# Patient Record
Sex: Female | Born: 1955 | Race: Black or African American | Hispanic: No | Marital: Married | Smoking: Never smoker
Health system: Southern US, Community
[De-identification: ages and names within clinical notes are randomized; demographics above are authoritative.]

---

## 2015-12-09 DIAGNOSIS — E875 Hyperkalemia: Secondary | ICD-10-CM | POA: Insufficient documentation

## 2015-12-09 DIAGNOSIS — Z992 Dependence on renal dialysis: Secondary | ICD-10-CM | POA: Insufficient documentation

## 2015-12-12 DIAGNOSIS — I10 Essential (primary) hypertension: Secondary | ICD-10-CM | POA: Diagnosis present

## 2015-12-12 DIAGNOSIS — I2510 Atherosclerotic heart disease of native coronary artery without angina pectoris: Secondary | ICD-10-CM | POA: Insufficient documentation

## 2015-12-12 DIAGNOSIS — J449 Chronic obstructive pulmonary disease, unspecified: Secondary | ICD-10-CM | POA: Insufficient documentation

## 2021-05-08 ENCOUNTER — Emergency Department (HOSPITAL_COMMUNITY): Payer: Medicaid Other

## 2021-05-08 ENCOUNTER — Inpatient Hospital Stay (HOSPITAL_COMMUNITY)
Admission: EM | Admit: 2021-05-08 | Discharge: 2021-05-10 | DRG: 070 | Disposition: A | Discharge status: Home / Self Care | Payer: Medicaid Other | Attending: Internal Medicine | Admitting: Internal Medicine

## 2021-05-08 ENCOUNTER — Encounter (HOSPITAL_COMMUNITY): Payer: Self-pay | Admitting: Emergency Medicine

## 2021-05-08 DIAGNOSIS — N2581 Secondary hyperparathyroidism of renal origin: Secondary | ICD-10-CM | POA: Diagnosis present

## 2021-05-08 DIAGNOSIS — Z7982 Long term (current) use of aspirin: Secondary | ICD-10-CM

## 2021-05-08 DIAGNOSIS — R4182 Altered mental status, unspecified: Secondary | ICD-10-CM

## 2021-05-08 DIAGNOSIS — I482 Chronic atrial fibrillation, unspecified: Secondary | ICD-10-CM | POA: Diagnosis present

## 2021-05-08 DIAGNOSIS — Z91013 Allergy to seafood: Secondary | ICD-10-CM

## 2021-05-08 DIAGNOSIS — D631 Anemia in chronic kidney disease: Secondary | ICD-10-CM | POA: Diagnosis present

## 2021-05-08 DIAGNOSIS — I10 Essential (primary) hypertension: Secondary | ICD-10-CM | POA: Diagnosis present

## 2021-05-08 DIAGNOSIS — J9611 Chronic respiratory failure with hypoxia: Secondary | ICD-10-CM

## 2021-05-08 DIAGNOSIS — Z20822 Contact with and (suspected) exposure to covid-19: Secondary | ICD-10-CM | POA: Diagnosis present

## 2021-05-08 DIAGNOSIS — I2510 Atherosclerotic heart disease of native coronary artery without angina pectoris: Secondary | ICD-10-CM | POA: Diagnosis present

## 2021-05-08 DIAGNOSIS — Z6841 Body Mass Index (BMI) 40.0 and over, adult: Secondary | ICD-10-CM

## 2021-05-08 DIAGNOSIS — R748 Abnormal levels of other serum enzymes: Secondary | ICD-10-CM

## 2021-05-08 DIAGNOSIS — M79605 Pain in left leg: Secondary | ICD-10-CM

## 2021-05-08 DIAGNOSIS — Z862 Personal history of diseases of the blood and blood-forming organs and certain disorders involving the immune mechanism: Secondary | ICD-10-CM

## 2021-05-08 DIAGNOSIS — Z992 Dependence on renal dialysis: Secondary | ICD-10-CM

## 2021-05-08 DIAGNOSIS — Z888 Allergy status to other drugs, medicaments and biological substances status: Secondary | ICD-10-CM

## 2021-05-08 DIAGNOSIS — Z7951 Long term (current) use of inhaled steroids: Secondary | ICD-10-CM

## 2021-05-08 DIAGNOSIS — K219 Gastro-esophageal reflux disease without esophagitis: Secondary | ICD-10-CM

## 2021-05-08 DIAGNOSIS — Z7901 Long term (current) use of anticoagulants: Secondary | ICD-10-CM

## 2021-05-08 DIAGNOSIS — Z79899 Other long term (current) drug therapy: Secondary | ICD-10-CM

## 2021-05-08 DIAGNOSIS — G9341 Metabolic encephalopathy: Principal | ICD-10-CM | POA: Diagnosis present

## 2021-05-08 DIAGNOSIS — N186 End stage renal disease: Secondary | ICD-10-CM

## 2021-05-08 DIAGNOSIS — Z91041 Radiographic dye allergy status: Secondary | ICD-10-CM

## 2021-05-08 DIAGNOSIS — J449 Chronic obstructive pulmonary disease, unspecified: Secondary | ICD-10-CM | POA: Diagnosis present

## 2021-05-08 DIAGNOSIS — I120 Hypertensive chronic kidney disease with stage 5 chronic kidney disease or end stage renal disease: Secondary | ICD-10-CM | POA: Diagnosis present

## 2021-05-08 DIAGNOSIS — M170 Bilateral primary osteoarthritis of knee: Secondary | ICD-10-CM

## 2021-05-08 DIAGNOSIS — Z886 Allergy status to analgesic agent status: Secondary | ICD-10-CM

## 2021-05-08 LAB — AMMONIA: Ammonia: 24 umol/L (ref 9–35)

## 2021-05-08 LAB — COMPREHENSIVE METABOLIC PANEL
ALT: 19 U/L (ref 0–44)
AST: 22 U/L (ref 15–41)
Albumin: 4.1 g/dL (ref 3.5–5.0)
Alkaline Phosphatase: 158 U/L — ABNORMAL HIGH (ref 38–126)
Anion gap: 22 — ABNORMAL HIGH (ref 5–15)
BUN: 44 mg/dL — ABNORMAL HIGH (ref 8–23)
CO2: 21 mmol/L — ABNORMAL LOW (ref 22–32)
Calcium: 8.5 mg/dL — ABNORMAL LOW (ref 8.9–10.3)
Chloride: 100 mmol/L (ref 98–111)
Creatinine, Ser: 10.64 mg/dL — ABNORMAL HIGH (ref 0.44–1.00)
GFR, Estimated: 4 mL/min — ABNORMAL LOW (ref >60)
Glucose, Bld: 118 mg/dL — ABNORMAL HIGH (ref 70–99)
Potassium: 4.4 mmol/L (ref 3.5–5.1)
Sodium: 143 mmol/L (ref 135–145)
Total Bilirubin: 0.8 mg/dL (ref 0.3–1.2)
Total Protein: 7.1 g/dL (ref 6.5–8.1)

## 2021-05-08 LAB — RESP PANEL BY RT-PCR (FLU A&B, COVID) ARPGX2
Influenza A by PCR: NEGATIVE
Influenza B by PCR: NEGATIVE
SARS Coronavirus 2 by RT PCR: NEGATIVE

## 2021-05-08 LAB — CK: Total CK: 186 U/L (ref 38–234)

## 2021-05-08 LAB — BLOOD GAS, VENOUS
Acid-Base Excess: 1.4 mmol/L (ref 0.0–2.0)
Bicarbonate: 24.9 mmol/L (ref 20.0–28.0)
Drawn by: 1517
FIO2: 28
O2 Saturation: 89.9 %
Patient temperature: 35.2
pCO2, Ven: 45.1 mmHg (ref 44.0–60.0)
pH, Ven: 7.375 (ref 7.250–7.430)
pO2, Ven: 62.5 mmHg — ABNORMAL HIGH (ref 32.0–45.0)

## 2021-05-08 LAB — CBG MONITORING, ED: Glucose-Capillary: 110 mg/dL — ABNORMAL HIGH (ref 70–99)

## 2021-05-08 LAB — CBC WITH DIFFERENTIAL/PLATELET
Abs Immature Granulocytes: 0.06 10*3/uL (ref 0.00–0.07)
Basophils Absolute: 0.1 10*3/uL (ref 0.0–0.1)
Basophils Relative: 0 %
Eosinophils Absolute: 0.4 10*3/uL (ref 0.0–0.5)
Eosinophils Relative: 3 %
HCT: 40.5 % (ref 36.0–46.0)
Hemoglobin: 12.6 g/dL (ref 12.0–15.0)
Immature Granulocytes: 0 %
Lymphocytes Relative: 23 %
Lymphs Abs: 3.1 10*3/uL (ref 0.7–4.0)
MCH: 30.1 pg (ref 26.0–34.0)
MCHC: 31.1 g/dL (ref 30.0–36.0)
MCV: 96.7 fL (ref 80.0–100.0)
Monocytes Absolute: 0.9 10*3/uL (ref 0.1–1.0)
Monocytes Relative: 7 %
Neutro Abs: 9.1 10*3/uL — ABNORMAL HIGH (ref 1.7–7.7)
Neutrophils Relative %: 67 %
Platelets: 186 10*3/uL (ref 150–400)
RBC: 4.19 MIL/uL (ref 3.87–5.11)
RDW: 16.7 % — ABNORMAL HIGH (ref 11.5–15.5)
WBC: 13.6 10*3/uL — ABNORMAL HIGH (ref 4.0–10.5)
nRBC: 0 % (ref 0.0–0.2)

## 2021-05-08 LAB — APTT: aPTT: 31 seconds (ref 24–36)

## 2021-05-08 LAB — PROTIME-INR
INR: 1 (ref 0.8–1.2)
Prothrombin Time: 13.3 seconds (ref 11.4–15.2)

## 2021-05-08 LAB — LACTIC ACID, PLASMA: Lactic Acid, Venous: 3 mmol/L (ref 0.5–1.9)

## 2021-05-08 NOTE — ED Triage Notes (Signed)
Pt brought in from home. Family called EMS for unresponsive. Pt altered at this time. Pt only received half of her last dialysis treatment on Saturday.

## 2021-05-08 NOTE — ED Provider Notes (Signed)
Are assumed from Dr. Sabra Heck, patient with altered mental status, pending CT of head and ammonia level. Will need admission.  CT scan is unremarkable.  Ammonia levels normal.  Patient continues to show altered mental status consistent with metabolic encephalopathy.  Case is discussed with Dr. Clearence Ped of [facility], who agrees to admit the patient.  Results for orders placed or performed during the hospital encounter of 05/08/21  Resp Panel by RT-PCR (Flu A&B, Covid) Nasopharyngeal Swab   Specimen: Nasopharyngeal Swab; Nasopharyngeal(NP) swabs in vial transport medium  Result Value Ref Range   SARS Coronavirus 2 by RT PCR NEGATIVE NEGATIVE   Influenza A by PCR NEGATIVE NEGATIVE   Influenza B by PCR NEGATIVE NEGATIVE  Blood Cultures (routine x 2)   Specimen: Right Antecubital; Blood  Result Value Ref Range   Specimen Description      RIGHT ANTECUBITAL BOTTLES DRAWN AEROBIC AND ANAEROBIC   Special Requests      Blood Culture adequate volume Performed at [facility], [address], [place], [place] [address]    Culture PENDING    Report Status PENDING   Comprehensive metabolic panel  Result Value Ref Range   Sodium 143 135 - 145 mmol/L   Potassium 4.4 3.5 - 5.1 mmol/L   Chloride 100 98 - 111 mmol/L   CO2 21 (L) 22 - 32 mmol/L   Glucose, Bld 118 (H) 70 - 99 mg/dL   BUN 44 (H) 8 - 23 mg/dL   Creatinine, Ser 10.64 (H) 0.44 - 1.00 mg/dL   Calcium 8.5 (L) 8.9 - 10.3 mg/dL   Total Protein 7.1 6.5 - 8.1 g/dL   Albumin 4.1 3.5 - 5.0 g/dL   AST 22 15 - 41 U/L   ALT 19 0 - 44 U/L   Alkaline Phosphatase 158 (H) 38 - 126 U/L   Total Bilirubin 0.8 0.3 - 1.2 mg/dL   GFR, Estimated 4 (L) >60 mL/min   Anion gap 22 (H) 5 - 15  CBC WITH DIFFERENTIAL  Result Value Ref Range   WBC 13.6 (H) 4.0 - 10.5 K/uL   RBC 4.19 3.87 - 5.11 MIL/uL   Hemoglobin 12.6 12.0 - 15.0 g/dL   HCT 40.5 36.0 - 46.0 %   MCV 96.7 80.0 - 100.0 fL   MCH 30.1 26.0 - 34.0 pg   MCHC 31.1 30.0 - 36.0 g/dL    RDW 16.7 (H) 11.5 - 15.5 %   Platelets 186 150 - 400 K/uL   nRBC 0.0 0.0 - 0.2 %   Neutrophils Relative % 67 %   Neutro Abs 9.1 (H) 1.7 - 7.7 K/uL   Lymphocytes Relative 23 %   Lymphs Abs 3.1 0.7 - 4.0 K/uL   Monocytes Relative 7 %   Monocytes Absolute 0.9 0.1 - 1.0 K/uL   Eosinophils Relative 3 %   Eosinophils Absolute 0.4 0.0 - 0.5 K/uL   Basophils Relative 0 %   Basophils Absolute 0.1 0.0 - 0.1 K/uL   Immature Granulocytes 0 %   Abs Immature Granulocytes 0.06 0.00 - 0.07 K/uL  Ammonia  Result Value Ref Range   Ammonia 24 9 - 35 umol/L  Lactic acid, plasma  Result Value Ref Range   Lactic Acid, Venous 3.0 (HH) 0.5 - 1.9 mmol/L  CK  Result Value Ref Range   Total CK 186 38 - 234 U/L  Blood gas, venous  Result Value Ref Range   FIO2 28.00    pH, Ven 7.375 7.250 - 7.430  pCO2, Ven 45.1 44.0 - 60.0 mmHg   pO2, Ven 62.5 (H) 32.0 - 45.0 mmHg   Bicarbonate 24.9 20.0 - 28.0 mmol/L   Acid-Base Excess 1.4 0.0 - 2.0 mmol/L   O2 Saturation 89.9 %   Patient temperature 35.2    Collection site BLOOD RIGHT HAND    Drawn by 1517    Sample type VENOUS   Protime-INR  Result Value Ref Range   Prothrombin Time 13.3 11.4 - 15.2 seconds   INR 1.0 0.8 - 1.2  APTT  Result Value Ref Range   aPTT 31 24 - 36 seconds  CBG monitoring, ED  Result Value Ref Range   Glucose-Capillary 110 (H) 70 - 99 mg/dL   DG Chest Port 1 View  Result Date: 05/08/2021 CLINICAL DATA:  Altered mental status EXAM: PORTABLE CHEST 1 VIEW COMPARISON:  04/12/2021 FINDINGS: Cardiac shadow is enlarged but stable. 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 The lungs are well aerated bilaterally. Mild central vascular congestion is seen. No bony abnormality is noted. Changes of prior vascular stenting are noted in the left shoulder region. IMPRESSION: Mild vascular congestion without interstitial edema. Electronically Signed   By: Inez Catalina M.D.   On: 05/08/2021 23:02   CT HEAD CODE STROKE WO CONTRAST  Addendum Date:  05/08/2021   ADDENDUM REPORT: 05/08/2021 23:38 ADDENDUM: These results were called by telephone at the time of interpretation on 05/08/2021 at 58:30 pm to Dr. Roxanne Mins, who verbally acknowledged these results. Electronically Signed   By: Ulyses Jarred M.D.   On: 05/08/2021 23:38   Result Date: 05/08/2021 CLINICAL DATA:  Code stroke.  Acute neurologic deficits EXAM: CT HEAD WITHOUT CONTRAST TECHNIQUE: Contiguous axial images were obtained from the base of the skull through the vertex without intravenous contrast. COMPARISON:  None. FINDINGS: Brain: There is no mass, hemorrhage or extra-axial collection. The size and configuration of the ventricles and extra-axial CSF spaces are normal. The brain parenchyma is normal, without evidence of acute or chronic infarction. Vascular: No abnormal hyperdensity of the major intracranial arteries or dural venous sinuses. No intracranial atherosclerosis. Skull: The visualized skull base, calvarium and extracranial soft tissues are normal. Sinuses/Orbits: No fluid levels or advanced mucosal thickening of the visualized paranasal sinuses. No mastoid or middle ear effusion. The orbits are normal. ASPECTS [facility] Stroke Program Early CT Score) - Ganglionic level infarction (caudate, lentiform nuclei, internal capsule, insula, M1-M3 cortex): 7 - Supraganglionic infarction (M4-M6 cortex): 3 Total score (0-10 with 10 being normal): 10 IMPRESSION: 1. Normal head CT. 2. ASPECTS is 10. Electronically Signed: By: Ulyses Jarred M.D. On: 05/08/2021 23:33    Images viewed by me.    Delora Fuel, MD 94/07/68 [phone]

## 2021-05-08 NOTE — ED Notes (Signed)
Patient transported to CT 

## 2021-05-08 NOTE — ED Provider Notes (Signed)
[facility] EMERGENCY DEPARTMENT Provider Note   CSN: [id number] Arrival date & time: 05/08/21  2157     History Chief Complaint  Patient presents with   Altered Mental Status   Level 5 caveat Leslie Wilcox is a 65 y.o. female. Patient presents to the emergency department for altered mental status.  She has a dialysis patient and last had dialysis on Saturday, however she only received half of her treatment.  Awaiting family for more history.  According to granddaughter, last known normal was 2 PM today.  Apparently patient went for a nap and when she woke up around 9 PM today she did not seem like herself.  They said she was "shaking" and unsteady on her feet. She was not speaking like she normally does. Apparently patient's baseline is needing minimal assistance. She walks with a walker at home.     Altered Mental Status Presenting symptoms: confusion       Past Medical History:  Diagnosis Date   Arthritis    Asthma    CHF (congestive heart failure) (HCC)    COPD (chronic obstructive pulmonary disease) ([place])    Coronary artery disease    Dialysis patient [facility])    Hypertension    Renal disorder     There are no problems to display for this patient.   History reviewed. No pertinent surgical history.   OB History   No obstetric history on file.     History reviewed. No pertinent family history.     Home Medications Prior to Admission medications   Not on File    Allergies    Amoxicillin, Asa [aspirin], Gabapentin, Iodinated contrast media, and Shellfish allergy  Review of Systems   Review of Systems  Unable to perform ROS: Mental status change  Psychiatric/Behavioral:  Positive for confusion.    Physical Exam Updated Vital Signs BP (!) 144/68    Pulse 91    Temp (!) 95.4 F (35.2 C) (Rectal)    Resp 11    SpO2 100%   Physical Exam Vitals and nursing note reviewed.  Constitutional:      General: She is not in acute distress.    Appearance:  Normal appearance. She is obese. She is ill-appearing. She is not toxic-appearing or diaphoretic.     Comments: She is chronically ill-appearing  HENT:     Head: Normocephalic and atraumatic.     Nose: No nasal deformity.     Mouth/Throat:     Lips: Pink. No lesions.     Mouth: Mucous membranes are moist. No injury, lacerations, oral lesions or angioedema.     Pharynx: Oropharynx is clear. Uvula midline. No pharyngeal swelling, oropharyngeal exudate, posterior oropharyngeal erythema or uvula swelling.  Eyes:     General: Gaze aligned appropriately. Scleral icterus present.        Right eye: No discharge.        Left eye: No discharge.     Conjunctiva/sclera: Conjunctivae normal.     Right eye: Right conjunctiva is not injected. No exudate or hemorrhage.    Left eye: Left conjunctiva is not injected. No exudate or hemorrhage.    Pupils: Pupils are equal, round, and reactive to light.  Cardiovascular:     Rate and Rhythm: Regular rhythm. Tachycardia present.     Pulses: Normal pulses.          Radial pulses are 2+ on the right side and 2+ on the left side.       Dorsalis pedis  pulses are 2+ on the right side and 2+ on the left side.     Heart sounds: Normal heart sounds, S1 normal and S2 normal. Heart sounds not distant. No murmur heard.   No friction rub. No gallop. No S3 or S4 sounds.     Comments: Fistula on left upper arm with thrill and bruit Pulmonary:     Effort: Pulmonary effort is normal. No accessory muscle usage or respiratory distress.     Breath sounds: Normal breath sounds. No stridor. No wheezing, rhonchi or rales.     Comments: Bilateral lungs are diminished to auscultation likely due to body habitus. Chest:     Chest wall: No tenderness.  Abdominal:     General: Abdomen is flat. Bowel sounds are normal. There is no distension.     Palpations: Abdomen is soft. There is no mass or pulsatile mass.     Tenderness: There is no abdominal tenderness. There is no guarding or  rebound.  Musculoskeletal:     Right lower leg: No edema.     Left lower leg: No edema.  Skin:    General: Skin is warm and dry.     Coloration: Skin is not jaundiced or pale.     Findings: No bruising, erythema, lesion or rash.  Neurological:     General: No focal deficit present.     Mental Status: She is alert.     GCS: GCS eye subscore is 4. GCS verbal subscore is 4. GCS motor subscore is 6.     Cranial Nerves: Dysarthria present. No facial asymmetry.     Motor: No weakness, tremor or seizure activity.     Coordination: Finger-Nose-Finger Test abnormal.     Comments: Patient does follow commands.  However speech is limited.  She has very dysarthric speech, but nods yes and no appropriately.  She was able to move all 4 extremities on command.  No appreciable focal weakness noted.  No facial droop.  Pupils are round equal and reactive bilaterally.  She had difficulty doing finger to nose testing, however, she was eventually able to do this. Her eyes track from side to side.   Psychiatric:        Mood and Affect: Mood normal.        Behavior: Behavior normal. Behavior is cooperative.    ED Results / Procedures / Treatments   Labs (all labs ordered are listed, but only abnormal results are displayed) Labs Reviewed  COMPREHENSIVE METABOLIC PANEL - Abnormal; Notable for the following components:      Result Value   CO2 21 (*)    Glucose, Bld 118 (*)    BUN 44 (*)    Creatinine, Ser 10.64 (*)    Calcium 8.5 (*)    Alkaline Phosphatase 158 (*)    GFR, Estimated 4 (*)    Anion gap 22 (*)    All other components within normal limits  CBC WITH DIFFERENTIAL/PLATELET - Abnormal; Notable for the following components:   WBC 13.6 (*)    RDW 16.7 (*)    Neutro Abs 9.1 (*)    All other components within normal limits  LACTIC ACID, PLASMA - Abnormal; Notable for the following components:   Lactic Acid, Venous 3.0 (*)    All other components within normal limits  BLOOD GAS, VENOUS -  Abnormal; Notable for the following components:   pO2, Ven 62.5 (*)    All other components within normal limits  CBG MONITORING, ED - Abnormal;  Notable for the following components:   Glucose-Capillary 110 (*)    All other components within normal limits  RESP PANEL BY RT-PCR (FLU A&B, COVID) [id number]  CULTURE, BLOOD (ROUTINE X 2)  CULTURE, BLOOD (ROUTINE X 2)  CK  PROTIME-INR  APTT  AMMONIA  CBG MONITORING, ED    EKG EKG Interpretation  Date/Time:  Sunday May 08 2021 22:02:18 EST Ventricular Rate:  101 PR Interval:  137 QRS Duration: 99 QT Interval:  359 QTC Calculation: 466 R Axis:   40 Text Interpretation: duplicate, discard Confirmed by Delora Fuel ([id number]) on 05/08/2021 11:18:58 PM  Radiology DG Chest Port 1 View  Result Date: 05/08/2021 CLINICAL DATA:  Altered mental status EXAM: PORTABLE CHEST 1 VIEW COMPARISON:  04/12/2021 FINDINGS: Cardiac shadow is enlarged but stable. 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 The lungs are well aerated bilaterally. Mild central vascular congestion is seen. No bony abnormality is noted. Changes of prior vascular stenting are noted in the left shoulder region. IMPRESSION: Mild vascular congestion without interstitial edema. Electronically Signed   By: Inez Catalina M.D.   On: 05/08/2021 23:02    Procedures Procedures   Medications Ordered in ED Medications - No data to display  ED Course  I have reviewed the triage vital signs and the nursing notes.  Pertinent labs & imaging results that were available during my care of the patient were reviewed by me and considered in my medical decision making (see chart for details).  Clinical Course as of 05/08/21 2320  Sun May 08, 2021  2247 Lactic Acid, Venous(!!): 3.0 [GL]  2247 WBC(!): 13.6 [GL]  2248 Last known normal 2 pm [GL]    Clinical Course User Index [GL] Jacquie Lukes, Adora Fridge, PA-C   MDM Rules/Calculators/A&P                          This is a 65 year old female who presents  the emergency department with altered mental status.  Her last known normal was apparently 2 PM today where she went for a nap.  She woke up and according to family, was very altered from her baseline.  Her past medical history is significant for end-stage renal disease requiring dialysis.  She last had dialysis on Saturday where she received half of her treatment due to being late for it.  She is anuric at baseline.  Hx of History of COPD and uses 2 L of home oxygen.  She otherwise has been dealing with sciatica of her left lower extremity and apparently was placed on new medications recently and she is concerned that this could be causing some of the patient's symptoms.  At this time, her daughter is going home to gather her prescriptions as we have no record of them.  We have no other record of patient's medical history on file.  On exam, patient is encephalopathic.  She does not have any focal neurological deficits, however her speech does seem dysarthric and difficult to understand.  She is following commands, however it takes several attempts before she will do what you ask.  Her lungs are diminished bilaterally.  No wheezing or crackles heard on auscultation.  No abdominal tenderness on exam.  Will obtain head CT to evaluate for intracranial abnormality.  Also obtain blood cultures, basic labs, ammonia, lactic acid, chest x-ray, EKG, CK, and VBG.  11:20 PM Care of Chaney Born  transferred to Dr. Roxanne Mins at the end of my shift as the patient will  require reassessment once labs/imaging have resulted. Patient presentation, ED course, and plan of care discussed with review of all pertinent labs and imaging. Please see his/her note for further details regarding further ED course and disposition. Plan at time of handoff is follow up on labs and imaging, likely admit. This may be altered or completely changed at the discretion of the oncoming team pending results of further workup.     Final Clinical  Impression(s) / ED Diagnoses Final diagnoses:  AMS (altered mental status)    Rx / DC Orders ED Discharge Orders     None        Adolphus Birchwood, PA-C 05/08/21 2320    Noemi Chapel, MD 05/08/21 2326

## 2021-05-08 NOTE — ED Provider Notes (Signed)
Medical screening examination/treatment/procedure(s) were conducted as a shared visit with non-physician practitioner(s) and myself.  I personally evaluated the patient during the encounter.  Clinical Impression:   Final diagnoses:  AMS (altered mental status)      This patient is a morbidly obese 65 year old female, she is on dialysis, she has access in her left upper extremity and last went to dialysis yesterday for 3 out of the 4 hours because she showed up late.  Evidently the patient has recently been prescribed a medication for the pain in her left leg, the family members who are the primary historians at the bedside do not know the name of the medication but they will go home and get it.  Every time she takes the medications they think that she has some confusion.  She was last seen normal today at around 2:00 in the afternoon when she went down for a nap, when she woke up a short time ago she was very altered, she could not answer questions, she seemed to be agitated, there was some shaking spells that they witnessed though she was totally lucid during this time.  Her speech has been slurred, erratic, not answering questions appropriately.  The patient is not able to give me any valuable information.  On exam the patient has a normal blood pressure normal heart rate no edema of the legs, she does have an intact fistula with a good thrill in the left upper extremity, lungs are clear and abdomen is soft.  Her mental status is very abnormal, she appears encephalopathic, she is agitated moving around in the bed, she is able to move both arms and both legs but seems to do this very slowly when asked to do so.  She cannot perform accurate finger-nose-finger with either upper extremity but is able to grab my fingers occasionally when she has prompted.  When the patient becomes very agitated by my exam more has pain in her left leg when I manipulate the leg she speaks very clearly about not wanting that  to happen.  She has normal pupillary exam and is able to cross the midline bilaterally, she does not appear to have any extinction.  There is no facial droop.  The patient is encephalopathic, this may be related to medications as the family members do think that she took the pain medications prior to taking her nap.  CT scan will be ordered, she will likely need an observational admission for further evaluation unless she completely clears and is able to give a very clear history.  The family members report that she has not had any history of stroke in the past.  They will go home and gather her medicines ([place]).  At change of shift - care signed out to Dr. Roxanne Mins to f/u results and disposition accordingly.   Noemi Chapel, MD 05/08/21 [phone]

## 2021-05-08 NOTE — ED Notes (Signed)
Was not able to get any urine from in and out cath. PA made aware.

## 2021-05-09 ENCOUNTER — Encounter (HOSPITAL_COMMUNITY): Payer: Self-pay | Admitting: Family Medicine

## 2021-05-09 ENCOUNTER — Inpatient Hospital Stay (HOSPITAL_COMMUNITY)
Admit: 2021-05-09 | Discharge: 2021-05-09 | Disposition: A | Discharge status: Home / Self Care | Payer: Medicaid Other | Attending: Family Medicine | Admitting: Family Medicine

## 2021-05-09 ENCOUNTER — Other Ambulatory Visit: Payer: Self-pay

## 2021-05-09 ENCOUNTER — Observation Stay (HOSPITAL_COMMUNITY): Payer: Medicaid Other

## 2021-05-09 DIAGNOSIS — J449 Chronic obstructive pulmonary disease, unspecified: Secondary | ICD-10-CM | POA: Diagnosis present

## 2021-05-09 DIAGNOSIS — Z20822 Contact with and (suspected) exposure to covid-19: Secondary | ICD-10-CM | POA: Diagnosis present

## 2021-05-09 DIAGNOSIS — I482 Chronic atrial fibrillation, unspecified: Secondary | ICD-10-CM | POA: Diagnosis present

## 2021-05-09 DIAGNOSIS — Z7901 Long term (current) use of anticoagulants: Secondary | ICD-10-CM | POA: Diagnosis not present

## 2021-05-09 DIAGNOSIS — Z91013 Allergy to seafood: Secondary | ICD-10-CM | POA: Diagnosis not present

## 2021-05-09 DIAGNOSIS — M170 Bilateral primary osteoarthritis of knee: Secondary | ICD-10-CM | POA: Diagnosis present

## 2021-05-09 DIAGNOSIS — Z886 Allergy status to analgesic agent status: Secondary | ICD-10-CM | POA: Diagnosis not present

## 2021-05-09 DIAGNOSIS — G9341 Metabolic encephalopathy: Secondary | ICD-10-CM | POA: Diagnosis not present

## 2021-05-09 DIAGNOSIS — N2581 Secondary hyperparathyroidism of renal origin: Secondary | ICD-10-CM | POA: Diagnosis present

## 2021-05-09 DIAGNOSIS — Z6841 Body Mass Index (BMI) 40.0 and over, adult: Secondary | ICD-10-CM | POA: Diagnosis not present

## 2021-05-09 DIAGNOSIS — D631 Anemia in chronic kidney disease: Secondary | ICD-10-CM | POA: Diagnosis present

## 2021-05-09 DIAGNOSIS — I2510 Atherosclerotic heart disease of native coronary artery without angina pectoris: Secondary | ICD-10-CM | POA: Diagnosis present

## 2021-05-09 DIAGNOSIS — I10 Essential (primary) hypertension: Secondary | ICD-10-CM | POA: Diagnosis not present

## 2021-05-09 DIAGNOSIS — Z992 Dependence on renal dialysis: Secondary | ICD-10-CM | POA: Diagnosis not present

## 2021-05-09 DIAGNOSIS — K219 Gastro-esophageal reflux disease without esophagitis: Secondary | ICD-10-CM | POA: Diagnosis present

## 2021-05-09 DIAGNOSIS — I120 Hypertensive chronic kidney disease with stage 5 chronic kidney disease or end stage renal disease: Secondary | ICD-10-CM | POA: Diagnosis present

## 2021-05-09 DIAGNOSIS — J9611 Chronic respiratory failure with hypoxia: Secondary | ICD-10-CM | POA: Diagnosis present

## 2021-05-09 DIAGNOSIS — Z888 Allergy status to other drugs, medicaments and biological substances status: Secondary | ICD-10-CM | POA: Diagnosis not present

## 2021-05-09 DIAGNOSIS — M79605 Pain in left leg: Secondary | ICD-10-CM | POA: Diagnosis not present

## 2021-05-09 DIAGNOSIS — Z7951 Long term (current) use of inhaled steroids: Secondary | ICD-10-CM | POA: Diagnosis not present

## 2021-05-09 DIAGNOSIS — Z79899 Other long term (current) drug therapy: Secondary | ICD-10-CM | POA: Diagnosis not present

## 2021-05-09 DIAGNOSIS — Z7982 Long term (current) use of aspirin: Secondary | ICD-10-CM | POA: Diagnosis not present

## 2021-05-09 DIAGNOSIS — Z91041 Radiographic dye allergy status: Secondary | ICD-10-CM | POA: Diagnosis not present

## 2021-05-09 DIAGNOSIS — N186 End stage renal disease: Secondary | ICD-10-CM | POA: Diagnosis present

## 2021-05-09 LAB — HEPATITIS B SURFACE ANTIGEN: Hepatitis B Surface Ag: NONREACTIVE

## 2021-05-09 LAB — MRSA NEXT GEN BY PCR, NASAL: MRSA by PCR Next Gen: NOT DETECTED

## 2021-05-09 LAB — CBC WITH DIFFERENTIAL/PLATELET
Abs Immature Granulocytes: 0.06 10*3/uL (ref 0.00–0.07)
Basophils Absolute: 0 10*3/uL (ref 0.0–0.1)
Basophils Relative: 0 %
Eosinophils Absolute: 0.3 10*3/uL (ref 0.0–0.5)
Eosinophils Relative: 3 %
HCT: 39.4 % (ref 36.0–46.0)
Hemoglobin: 11.6 g/dL — ABNORMAL LOW (ref 12.0–15.0)
Immature Granulocytes: 1 %
Lymphocytes Relative: 17 %
Lymphs Abs: 1.6 10*3/uL (ref 0.7–4.0)
MCH: 29.5 pg (ref 26.0–34.0)
MCHC: 29.4 g/dL — ABNORMAL LOW (ref 30.0–36.0)
MCV: 100.3 fL — ABNORMAL HIGH (ref 80.0–100.0)
Monocytes Absolute: 0.7 10*3/uL (ref 0.1–1.0)
Monocytes Relative: 7 %
Neutro Abs: 7.2 10*3/uL (ref 1.7–7.7)
Neutrophils Relative %: 72 %
Platelets: 113 10*3/uL — ABNORMAL LOW (ref 150–400)
RBC: 3.93 MIL/uL (ref 3.87–5.11)
RDW: 16.8 % — ABNORMAL HIGH (ref 11.5–15.5)
WBC: 9.9 10*3/uL (ref 4.0–10.5)
nRBC: 0 % (ref 0.0–0.2)

## 2021-05-09 LAB — COMPREHENSIVE METABOLIC PANEL
ALT: 16 U/L (ref 0–44)
AST: 17 U/L (ref 15–41)
Albumin: 3.6 g/dL (ref 3.5–5.0)
Alkaline Phosphatase: 136 U/L — ABNORMAL HIGH (ref 38–126)
Anion gap: 21 — ABNORMAL HIGH (ref 5–15)
BUN: 46 mg/dL — ABNORMAL HIGH (ref 8–23)
CO2: 21 mmol/L — ABNORMAL LOW (ref 22–32)
Calcium: 8.1 mg/dL — ABNORMAL LOW (ref 8.9–10.3)
Chloride: 102 mmol/L (ref 98–111)
Creatinine, Ser: 11.37 mg/dL — ABNORMAL HIGH (ref 0.44–1.00)
GFR, Estimated: 3 mL/min — ABNORMAL LOW (ref >60)
Glucose, Bld: 97 mg/dL (ref 70–99)
Potassium: 4.6 mmol/L (ref 3.5–5.1)
Sodium: 144 mmol/L (ref 135–145)
Total Bilirubin: 0.5 mg/dL (ref 0.3–1.2)
Total Protein: 6.2 g/dL — ABNORMAL LOW (ref 6.5–8.1)

## 2021-05-09 LAB — HIV ANTIBODY (ROUTINE TESTING W REFLEX): HIV Screen 4th Generation wRfx: NONREACTIVE

## 2021-05-09 LAB — LACTIC ACID, PLASMA: Lactic Acid, Venous: 1.6 mmol/L (ref 0.5–1.9)

## 2021-05-09 LAB — PROCALCITONIN: Procalcitonin: 1.29 ng/mL

## 2021-05-09 LAB — MAGNESIUM: Magnesium: 2.2 mg/dL (ref 1.7–2.4)

## 2021-05-09 MED ORDER — SODIUM CHLORIDE 0.9 % IV SOLN
2.0000 g | INTRAVENOUS | Status: DC
  Administered 2021-05-10: 02:00:00 2 g via INTRAVENOUS
  Filled 2021-05-09: qty 20

## 2021-05-09 MED ORDER — ACETAMINOPHEN 650 MG RE SUPP: 650.0000 mg | Freq: Four times a day (QID) | RECTAL | Status: DC | PRN

## 2021-05-09 MED ORDER — FAMOTIDINE 20 MG PO TABS
20.0000 mg | ORAL_TABLET | Freq: Two times a day (BID) | ORAL | Status: DC
  Filled 2021-05-09: qty 1

## 2021-05-09 MED ORDER — ASPIRIN 81 MG PO CHEW
81.0000 mg | CHEWABLE_TABLET | Freq: Every day | ORAL | Status: DC
  Administered 2021-05-09 – 2021-05-10 (×2): 81 mg via ORAL
  Filled 2021-05-09 (×2): qty 1

## 2021-05-09 MED ORDER — AMLODIPINE BESYLATE 5 MG PO TABS
5.0000 mg | ORAL_TABLET | Freq: Every day | ORAL | Status: DC
  Administered 2021-05-09: 09:00:00 5 mg via ORAL
  Filled 2021-05-09 (×2): qty 1

## 2021-05-09 MED ORDER — DULOXETINE HCL 30 MG PO CPEP
30.0000 mg | ORAL_CAPSULE | Freq: Every day | ORAL | Status: DC
  Administered 2021-05-09 – 2021-05-10 (×2): 30 mg via ORAL
  Filled 2021-05-09 (×2): qty 1

## 2021-05-09 MED ORDER — TIOTROPIUM BROMIDE MONOHYDRATE 18 MCG IN CAPS: 18.0000 ug | ORAL_CAPSULE | Freq: Every day | RESPIRATORY_TRACT | Status: DC

## 2021-05-09 MED ORDER — FERRIC CITRATE 1 GM 210 MG(FE) PO TABS
210.0000 mg | ORAL_TABLET | Freq: Three times a day (TID) | ORAL | Status: DC
  Administered 2021-05-09 (×3): 210 mg via ORAL
  Filled 2021-05-09 (×9): qty 1

## 2021-05-09 MED ORDER — AMIODARONE HCL 200 MG PO TABS
200.0000 mg | ORAL_TABLET | Freq: Every day | ORAL | Status: DC
  Administered 2021-05-09: 09:00:00 200 mg via ORAL
  Filled 2021-05-09 (×2): qty 1

## 2021-05-09 MED ORDER — ONDANSETRON HCL 4 MG/2ML IJ SOLN: 4.0000 mg | Freq: Four times a day (QID) | INTRAMUSCULAR | Status: DC | PRN

## 2021-05-09 MED ORDER — LACTULOSE 10 GM/15ML PO SOLN: 10.0000 g | Freq: Every day | ORAL | Status: DC | PRN

## 2021-05-09 MED ORDER — ATORVASTATIN CALCIUM 20 MG PO TABS
20.0000 mg | ORAL_TABLET | Freq: Every day | ORAL | Status: DC
  Administered 2021-05-09 – 2021-05-10 (×2): 20 mg via ORAL
  Filled 2021-05-09 (×2): qty 1

## 2021-05-09 MED ORDER — CLOPIDOGREL BISULFATE 75 MG PO TABS
75.0000 mg | ORAL_TABLET | Freq: Every day | ORAL | Status: DC
  Administered 2021-05-09 – 2021-05-10 (×2): 75 mg via ORAL
  Filled 2021-05-09 (×2): qty 1

## 2021-05-09 MED ORDER — BUDESONIDE 0.25 MG/2ML IN SUSP
0.2500 mg | Freq: Two times a day (BID) | RESPIRATORY_TRACT | Status: DC
  Administered 2021-05-09 – 2021-05-10 (×4): 0.25 mg via RESPIRATORY_TRACT
  Filled 2021-05-09 (×4): qty 2

## 2021-05-09 MED ORDER — HEPARIN SODIUM (PORCINE) 5000 UNIT/ML IJ SOLN: 5000.0000 [IU] | Freq: Three times a day (TID) | INTRAMUSCULAR | Status: DC

## 2021-05-09 MED ORDER — BUDESONIDE 180 MCG/ACT IN AEPB: 1.0000 | INHALATION_SPRAY | Freq: Two times a day (BID) | RESPIRATORY_TRACT | Status: DC

## 2021-05-09 MED ORDER — MORPHINE SULFATE (PF) 2 MG/ML IV SOLN
2.0000 mg | INTRAVENOUS | Status: DC | PRN
  Administered 2021-05-10: 12:00:00 2 mg via INTRAVENOUS
  Filled 2021-05-09: qty 1

## 2021-05-09 MED ORDER — ACETAMINOPHEN 325 MG PO TABS: 650.0000 mg | ORAL_TABLET | Freq: Four times a day (QID) | ORAL | Status: DC | PRN

## 2021-05-09 MED ORDER — APIXABAN 2.5 MG PO TABS
2.5000 mg | ORAL_TABLET | Freq: Two times a day (BID) | ORAL | Status: DC
  Administered 2021-05-09 – 2021-05-10 (×3): 2.5 mg via ORAL
  Filled 2021-05-09 (×3): qty 1

## 2021-05-09 MED ORDER — FAMOTIDINE 20 MG PO TABS
10.0000 mg | ORAL_TABLET | Freq: Every day | ORAL | Status: DC
  Administered 2021-05-09 – 2021-05-10 (×2): 10 mg via ORAL
  Filled 2021-05-09: qty 1

## 2021-05-09 MED ORDER — OXYCODONE HCL 5 MG PO TABS
5.0000 mg | ORAL_TABLET | ORAL | Status: DC | PRN
  Administered 2021-05-09 – 2021-05-10 (×4): 5 mg via ORAL
  Filled 2021-05-09 (×4): qty 1

## 2021-05-09 MED ORDER — SODIUM CHLORIDE 0.9 % IV SOLN
1.0000 g | INTRAVENOUS | Status: DC
  Administered 2021-05-09: 01:00:00 1 g via INTRAVENOUS
  Filled 2021-05-09: qty 10

## 2021-05-09 MED ORDER — ONDANSETRON HCL 4 MG PO TABS: 4.0000 mg | ORAL_TABLET | Freq: Four times a day (QID) | ORAL | Status: DC | PRN

## 2021-05-09 MED ORDER — CHLORHEXIDINE GLUCONATE CLOTH 2 % EX PADS
6.0000 | MEDICATED_PAD | Freq: Every day | CUTANEOUS | Status: DC
  Administered 2021-05-09 – 2021-05-10 (×2): 6 via TOPICAL

## 2021-05-09 MED ORDER — PANTOPRAZOLE SODIUM 40 MG IV SOLR
40.0000 mg | INTRAVENOUS | Status: DC
  Administered 2021-05-09 – 2021-05-10 (×2): 40 mg via INTRAVENOUS
  Filled 2021-05-09 (×2): qty 40

## 2021-05-09 MED ORDER — UMECLIDINIUM BROMIDE 62.5 MCG/ACT IN AEPB
1.0000 | INHALATION_SPRAY | Freq: Every day | RESPIRATORY_TRACT | Status: DC
  Administered 2021-05-09 – 2021-05-10 (×2): 1 via RESPIRATORY_TRACT
  Filled 2021-05-09: qty 7

## 2021-05-09 NOTE — Evaluation (Signed)
Physical Therapy Evaluation Patient Details Name: Lamyah Creed MRN: [id number] DOB: Feb 20, 1956 Today's Date: 05/09/2021  History of Present Illness  Ulyssa Walthour  is a 65 y.o. female, with history of CHF, COPD, coronary artery disease, ESRD, hypertension, with complaint of altered mental status.  Patient is obtunded and does not provide any history.  Chart review reveals the patient was recently prescribed baclofen for pain in her left lower extremity.  Family reported to the ED that every time she takes this medication she becomes confused.  She was last seen normal around 2 PM on Christmas Day.  She took a nap and when she woke up she was very altered.  She cannot answer questions and seemed agitated.  Family also noted loose and shaking spells.  They noted her speech to be slurred and erratic, and answering questions inappropriately. CT negative for acute infarct.   Clinical Impression   Patient presents with generalized weakness and decline in functional mobility requiring mod-max A for bed mobility. Assistance for safe sitting for EOB ADL participation with reduced activity tolerance evident by pt exertion/fatigue.  Transfers completed with max A for sit to stand and max A needed to maintain standing support with patient able to initiate limb advancement for transfers, but again requiring physical support throughout mobility due to weakness and balance deficits. Continued services while hospitalized to improve strength and function. Recommend SNF at this time to progress physical rehabilitation to enable safe transition to home environment       Recommendations for follow up therapy are one component of a multi-disciplinary discharge planning process, led by the attending physician.  Recommendations may be updated based on patient status, additional functional criteria and insurance authorization.  Follow Up Recommendations Skilled nursing-short term rehab (<3 hours/day)    Assistance  Recommended at Discharge Frequent or constant Supervision/Assistance  Functional Status Assessment Patient has had a recent decline in their functional status and demonstrates the ability to make significant improvements in function in a reasonable and predictable amount of time.  Equipment Recommendations  None recommended by PT    Recommendations for Other Services       Precautions / Restrictions Precautions Precautions: Fall Restrictions Weight Bearing Restrictions: No      Mobility  Bed Mobility Overal bed mobility: Needs Assistance Bed Mobility: Supine to Sit;Sit to Supine     Supine to sit: Mod assist;Max assist;HOB elevated Sit to supine: Max assist     Patient Response: Anxious  Transfers Overall transfer level: Needs assistance Equipment used: 1 person hand held assist Transfers: Sit to/from Stand;Bed to chair/wheelchair/BSC Sit to Stand: Max assist Stand pivot transfers: Max assist         General transfer comment: Defer to PT note    Ambulation/Gait   Gait Distance (Feet): 0 Feet           General Gait Details: pt unable to ambulate at this time due to weakness/deficits  Stairs            Wheelchair Mobility    Modified Rankin (Stroke Patients Only)       Balance Overall balance assessment: Needs assistance Sitting-balance support: Feet supported;Single extremity supported Sitting balance-Leahy Scale: Fair     Standing balance support: Reliant on assistive device for balance Standing balance-Leahy Scale: Poor                               Pertinent Vitals/Pain Pain Assessment: Faces Faces Pain  Scale: Hurts whole lot Breathing: occasional labored breathing, short period of hyperventilation Negative Vocalization: repeated troubled calling out, loud moaning/groaning, crying Facial Expression: facial grimacing Body Language: tense, distressed pacing, fidgeting Consolability: distracted or reassured by  voice/touch PAINAD Score: 7 Pain Location: LLE-thigh Pain Descriptors / Indicators: Crying;Discomfort;Grimacing;Moaning Pain Intervention(s): Limited activity within patient's tolerance;Monitored during session;Repositioned    Home Living Family/patient expects to be discharged to:: Private residence Living Arrangements: Alone Available Help at Discharge: Family;Available PRN/intermittently Type of Home: House Home Access: Stairs to enter Entrance Stairs-Rails: Psychiatric nurse of Steps: 2     Home Equipment: Conservation officer, nature (2 wheels)      Prior Function Prior Level of Function : Independent/Modified Independent             Mobility Comments: has RW per pt report ADLs Comments: independent per pt report     Hand Dominance   Dominant Hand: Right    Extremity/Trunk Assessment   Upper Extremity Assessment Upper Extremity Assessment: Generalized weakness    Lower Extremity Assessment Lower Extremity Assessment: Generalized weakness    Cervical / Trunk Assessment Cervical / Trunk Assessment: Normal  Communication   Communication: No difficulties  Cognition Arousal/Alertness: Awake/alert Behavior During Therapy: WFL for tasks assessed/performed Overall Cognitive Status: Within Functional Limits for tasks assessed                                          General Comments      Exercises     Assessment/Plan    PT Assessment Patient needs continued PT services  PT Problem List Decreased strength;Decreased activity tolerance;Decreased balance;Decreased mobility;Pain;Obesity       PT Treatment Interventions DME instruction;Gait training;Stair training;Functional mobility training;Therapeutic activities;Patient/family education;Neuromuscular re-education;Balance training;Therapeutic exercise;Wheelchair mobility training    PT Goals (Current goals can be found in the Care Plan section)  Acute Rehab PT Goals Patient Stated  Goal: return home PT Goal Formulation: With patient Time For Goal Achievement: 05/16/21 Potential to Achieve Goals: Good    Frequency Min 3X/week   Barriers to discharge Decreased caregiver support      Co-evaluation PT/OT/SLP Co-Evaluation/Treatment: Yes Reason for Co-Treatment: Complexity of the patient's impairments (multi-system involvement);For patient/therapist safety;To address functional/ADL transfers PT goals addressed during session: Mobility/safety with mobility;Balance OT goals addressed during session: ADL's and self-care       AM-PAC PT "6 Clicks" Mobility  Outcome Measure Help needed turning from your back to your side while in a flat bed without using bedrails?: A Lot Help needed moving from lying on your back to sitting on the side of a flat bed without using bedrails?: A Lot Help needed moving to and from a bed to a chair (including a wheelchair)?: A Lot Help needed standing up from a chair using your arms (e.g., wheelchair or bedside chair)?: A Lot Help needed to walk in hospital room?: Total Help needed climbing 3-5 steps with a railing? : Total 6 Click Score: 10    End of Session   Activity Tolerance: Patient limited by pain;Patient limited by fatigue Patient left: in bed;with call bell/phone within reach Nurse Communication: Mobility status PT Visit Diagnosis: Unsteadiness on feet (R26.81);Muscle weakness (generalized) (M62.81);Difficulty in walking, not elsewhere classified (R26.2)    Time: 9323-5573 PT Time Calculation (min) (ACUTE ONLY): 15 min   Charges:   PT Evaluation $PT Eval Moderate Complexity: 1 Mod  11:43 AM, 05/09/21 M. Sherlyn Lees, PT, DPT Physical Therapist- [place] Office Number: [phone]

## 2021-05-09 NOTE — H&P (Addendum)
TRH H&P    Patient Demographics:    Leslie Wilcox, is a 65 y.o. female  MRN: [id number]  DOB - November 04, 1955  Admit Date - 05/08/2021  Referring MD/NP/PA: Roxanne Mins  Outpatient Primary MD for the patient is Pcp, No  Patient coming from: Home  Chief complaint- altered mental status   HPI:    Leslie Wilcox  is a 65 y.o. female, with history of CHF, COPD, coronary artery disease, ESRD, hypertension, with complaint of altered mental status.  Patient is obtunded and does not provide any history.  Chart review reveals the patient was recently prescribed baclofen for pain in her left lower extremity.  Family reported to the ED that every time she takes this medication she becomes confused.  She was last seen normal around 2 PM on Christmas Day.  She took a nap and when she woke up she was very altered.  She cannot answer questions and seemed agitated.  Family also noted loose and shaking spells.  They noted her speech to be slurred and erratic, and answering questions inappropriately.  Patient is an ESRD patient.  Her last dialysis was on the 24th and she had 3 out of her 4 hours because she showed up late.  In the ED Temp 97.5, heart rate 82-102, respiratory rate 10-19, blood pressure 141/75 satting 100% pH 7.375 Ammonia 24 Slight leukocytosis at 13.6, hemoglobin 12.6 Chemistry panel reveals a decreased bicarb 21, increased BUN 44, increased creatinine 10.64 and ESRD patient Alk phos 158 Lactic acid 3.0 Negative respiratory panel Blood cultures pending Chest x-ray shows mild vascular congestion without interstitial edema CT head shows normal head CT Admission requested for further work-up and management of acute metabolic encephalopathy.    Review of systems:    The patient cannot provide any review of systems given her altered mental status   Past History of the following :    Past Medical History:  Diagnosis  Date   Arthritis    Asthma    CHF (congestive heart failure) (HCC)    COPD (chronic obstructive pulmonary disease) ([place])    Coronary artery disease    Dialysis patient ([place])    Hypertension    Renal disorder       History reviewed. No pertinent surgical history.    Social History:      Social History   Tobacco Use   Smoking status: Unknown   Smokeless tobacco: Not on file  Substance Use Topics   Alcohol use: Not on file       Family History :    History reviewed. No pertinent family history. Unfortunately patient could not review family history given her altered mental status   Home Medications:   Prior to Admission medications   Medication Sig Start Date End Date Taking? Authorizing Provider  acetaminophen (TYLENOL) 500 MG tablet Take by mouth.    [provider]  amiodarone (PACERONE) 200 MG tablet Take 200 mg by mouth daily.    [provider]  amLODipine (NORVASC) 5 MG tablet Take 5 mg by mouth daily.  [provider]  apixaban (ELIQUIS) 2.5 MG TABS tablet Take 2.5 mg by mouth 2 (two) times daily.    [provider]  aspirin 81 MG chewable tablet Chew 81 mg by mouth daily.    [provider]  B Complex-C-Folic Acid (RENAL VITAMIN PO) Take 1 tablet by mouth daily.    [provider]  baclofen (LIORESAL) 10 MG tablet Take 10 mg by mouth 2 (two) times daily.    [provider]  benzonatate (TESSALON) 100 MG capsule Take by mouth.    [provider]  budesonide-formoterol (SYMBICORT) 160-4.5 MCG/ACT inhaler Inhale into the lungs.    [provider]  diclofenac (VOLTAREN) 50 MG EC tablet Take 50 mg by mouth 2 (two) times daily.    [provider]  Docusate Sodium (DSS) 100 MG CAPS Take by mouth.    [provider]  DULoxetine (CYMBALTA) 30 MG capsule Take 30 mg by mouth daily.    [provider]  famotidine (PEPCID) 20 MG tablet Take 20 mg by mouth 2 (two)  times daily.    [provider]  ferric citrate (AURYXIA) 1 GM 210 MG(Fe) tablet Take 210 mg by mouth 3 (three) times daily with meals.    [provider]  ferrous sulfate 325 (65 FE) MG EC tablet Take 325 mg by mouth 3 (three) times daily with meals.    [provider]  ferrous sulfate 325 (65 FE) MG tablet Take 325 mg by mouth every other day.    [provider]  linaclotide (LINZESS) 290 MCG CAPS capsule Take 1 capsule by mouth daily.    [provider]  midodrine (PROAMATINE) 10 MG tablet Take 10 mg by mouth 3 (three) times daily.    [provider]  omeprazole (PRILOSEC) 20 MG capsule Take 40 mg by mouth daily.    [provider]  pregabalin (LYRICA) 50 MG capsule Take 150 mg by mouth daily.    [provider]  promethazine (PHENERGAN) 12.5 MG tablet Take by mouth.    [provider]  promethazine (PHENERGAN) 6.25 MG/5ML syrup Take by mouth every 6 (six) hours as needed for nausea or vomiting.    [provider]  promethazine-phenylephrine 6.25-5 MG/5ML SYRP Take by mouth every 4 (four) hours as needed for congestion.    [provider]  tiotropium (SPIRIVA) 18 MCG inhalation capsule Place into inhaler and inhale.    [provider]     Allergies:     Allergies  Allergen Reactions   Amoxicillin    Asa [Aspirin]    Gabapentin    Iodinated Contrast Media    Shellfish Allergy      Physical Exam:   Vitals  Blood pressure 119/67, pulse 82, temperature (!) 97.5 F (36.4 C), temperature source Axillary, resp. rate 13, height _0  (1.626 m), weight 107.7 kg, SpO2 100 %.   1.  General: Patient lying supine in bed,  no acute distress   2. Psychiatric: Obtunded  3. Neurologic: Face is symmetric, withdraws from pain in all 4 extremities, winces to sternal rub but does not open eyes, nonverbal, not following commands   4. HEENMT:  Head is atraumatic, normocephalic, pupils  reactive to light, neck is supple, trachea is midline, mucous membranes are moist   5. Respiratory : Lungs are diminished in the lower lung fields bilaterally without wheezing, rhonchi, rales, no cyanosis, no increase in work of breathing or accessory muscle use   6. Cardiovascular :  Heart rate normal, rhythm is regular, no murmurs, rubs or gallops, no peripheral edema, peripheral pulses palpated   7. Gastrointestinal:  Abdomen is soft, nondistended, nontender to palpation bowel sounds active, no masses or organomegaly palpated   8. Skin:  Skin is warm, dry and intact without rashes, acute lesions, or ulcers on limited exam   9.Musculoskeletal:  No acute deformities or trauma, no asymmetry in tone, no peripheral edema, peripheral pulses palpated, left lower extremity seems to be more tender on palpation compared to right with greater withdrawal and grimace    Data Review:    CBC Recent Labs  Lab 05/08/21 2200 05/09/21 0416  WBC 13.6* 9.9  HGB 12.6 11.6*  HCT 40.5 39.4  PLT 186 113*  MCV 96.7 100.3*  MCH 30.1 29.5  MCHC 31.1 29.4*  RDW 16.7* 16.8*  LYMPHSABS 3.1 1.6  MONOABS 0.9 0.7  EOSABS 0.4 0.3  BASOSABS 0.1 0.0   ------------------------------------------------------------------------------------------------------------------  Results for orders placed or performed during the hospital encounter of 05/08/21 (from the past 48 hour(s))  Resp Panel by RT-PCR (Flu A&B, Covid) Nasopharyngeal Swab     Status: None   Collection Time: 05/08/21 10:00 PM   Specimen: Nasopharyngeal Swab; Nasopharyngeal(NP) swabs in vial transport medium  Result Value Ref Range   SARS Coronavirus 2 by RT PCR NEGATIVE NEGATIVE    Comment: (NOTE) SARS-CoV-2 target nucleic acids are NOT DETECTED.  The SARS-CoV-2 RNA is generally detectable in upper respiratory specimens during the acute phase of infection. The lowest concentration of SARS-CoV-2 viral copies this assay can detect is 138  copies/mL. A negative result does not preclude SARS-Cov-2 infection and should not be used as the sole basis for treatment or other patient management decisions. A negative result may occur with  improper specimen collection/handling, submission of specimen other than nasopharyngeal swab, presence of viral mutation(s) within the areas targeted by this assay, and inadequate number of viral copies(<138 copies/mL). A negative result must be combined with clinical observations, patient history, and epidemiological information. The expected result is Negative.  Fact Sheet for Patients:  EntrepreneurPulse.com.au  Fact Sheet for Healthcare Providers:  IncredibleEmployment.be  This test is no t yet approved or cleared by the [place] FDA and  has been authorized for detection and/or diagnosis of SARS-CoV-2 by FDA under an Emergency Use Authorization (EUA). This EUA will remain  in effect (meaning this test can be used) for the duration of the COVID-19 declaration under Section 564(b)(1) of the Act, 21 U.S.C.section 360bbb-3(b)(1), unless the authorization is terminated  or revoked sooner.       Influenza A by PCR NEGATIVE NEGATIVE   Influenza B by PCR NEGATIVE NEGATIVE    Comment: (NOTE) The Xpert Xpress SARS-CoV-2/FLU/RSV plus assay is intended as an aid in the diagnosis of influenza from Nasopharyngeal swab specimens and should not be used as a sole basis for treatment. Nasal washings and aspirates are unacceptable for Xpert Xpress SARS-CoV-2/FLU/RSV testing.  Fact Sheet for Patients: EntrepreneurPulse.com.au  Fact Sheet for Healthcare Providers: IncredibleEmployment.be  This test is not yet approved or cleared by the [place] FDA and has been authorized for detection and/or diagnosis of SARS-CoV-2 by FDA under an Emergency Use Authorization (EUA). This EUA will remain in effect (meaning this test  can be used) for the duration of the COVID-19 declaration under Section 564(b)(1) of the Act, 21 U.S.C. section 360bbb-3(b)(1), unless the authorization is terminated or revoked.  Performed at [facility], [address] [place], [place] [address]  Comprehensive metabolic panel     Status: Abnormal   Collection Time: 05/08/21 10:00 PM  Result Value Ref Range   Sodium 143 135 - 145 mmol/L   Potassium 4.4 3.5 - 5.1 mmol/L   Chloride 100 98 - 111 mmol/L   CO2 21 (L) 22 - 32 mmol/L   Glucose, Bld 118 (H) 70 - 99 mg/dL    Comment: Glucose reference range applies only to samples taken after fasting for at least 8 hours.   BUN 44 (H) 8 - 23 mg/dL   Creatinine, Ser 10.64 (H) 0.44 - 1.00 mg/dL   Calcium 8.5 (L) 8.9 - 10.3 mg/dL   Total Protein 7.1 6.5 - 8.1 g/dL   Albumin 4.1 3.5 - 5.0 g/dL   AST 22 15 - 41 U/L   ALT 19 0 - 44 U/L   Alkaline Phosphatase 158 (H) 38 - 126 U/L   Total Bilirubin 0.8 0.3 - 1.2 mg/dL   GFR, Estimated 4 (L) >60 mL/min    Comment: (NOTE) Calculated using the CKD-EPI Creatinine Equation (2021)    Anion gap 22 (H) 5 - 15    Comment: REPEATED ON B TO CONFIRM Performed at [facility], [address], [place], [place] [address]   CBC WITH DIFFERENTIAL     Status: Abnormal   Collection Time: 05/08/21 10:00 PM  Result Value Ref Range   WBC 13.6 (H) 4.0 - 10.5 K/uL   RBC 4.19 3.87 - 5.11 MIL/uL   Hemoglobin 12.6 12.0 - 15.0 g/dL   HCT 40.5 36.0 - 46.0 %   MCV 96.7 80.0 - 100.0 fL   MCH 30.1 26.0 - 34.0 pg   MCHC 31.1 30.0 - 36.0 g/dL   RDW 16.7 (H) 11.5 - 15.5 %   Platelets 186 150 - 400 K/uL   nRBC 0.0 0.0 - 0.2 %   Neutrophils Relative % 67 %   Neutro Abs 9.1 (H) 1.7 - 7.7 K/uL   Lymphocytes Relative 23 %   Lymphs Abs 3.1 0.7 - 4.0 K/uL   Monocytes Relative 7 %   Monocytes Absolute 0.9 0.1 - 1.0 K/uL   Eosinophils Relative 3 %   Eosinophils Absolute 0.4 0.0 - 0.5 K/uL   Basophils Relative 0 %   Basophils Absolute 0.1 0.0 - 0.1 K/uL   Immature  Granulocytes 0 %   Abs Immature Granulocytes 0.06 0.00 - 0.07 K/uL    Comment: Performed at [facility], [address] [place], [place] [address]  Lactic acid, plasma     Status: Abnormal   Collection Time: 05/08/21 10:00 PM  Result Value Ref Range   Lactic Acid, Venous 3.0 (HH) 0.5 - 1.9 mmol/L    Comment: CRITICAL RESULT CALLED TO, READ BACK BY AND VERIFIED WITH: Hurman Horn AT 2241 ON 12.25.22 BY RUCINSKI,B Performed at [facility], [address], [place], [place] [address]   CK     Status: None   Collection Time: 05/08/21 10:00 PM  Result Value Ref Range   Total CK 186 38 - 234 U/L    Comment: Performed at [facility], [address], [place], [place] [address]  Protime-INR     Status: None   Collection Time: 05/08/21 10:00 PM  Result Value Ref Range   Prothrombin Time 13.3 11.4 - 15.2 seconds   INR 1.0 0.8 - 1.2    Comment: (NOTE) INR goal varies based on device and disease states. Performed at [facility], [address] [place], [place] [address]  APTT     Status: None   Collection Time: 05/08/21 10:00 PM  Result Value Ref Range   aPTT 31 24 - 36 seconds    Comment: Performed at [facility], [address], [place], [place] [address]  CBG monitoring, ED     Status: Abnormal   Collection Time: 05/08/21 10:07 PM  Result Value Ref Range   Glucose-Capillary 110 (H) 70 - 99 mg/dL    Comment: Glucose reference range applies only to samples taken after fasting for at least 8 hours.  Ammonia     Status: None   Collection Time: 05/08/21 10:55 PM  Result Value Ref Range   Ammonia 24 9 - 35 umol/L    Comment: Performed at [facility], [address], [place], [place] [address]  Blood gas, venous     Status: Abnormal   Collection Time: 05/08/21 10:56 PM  Result Value Ref Range   FIO2 28.00    pH, Ven 7.375 7.250 - 7.430   pCO2, Ven 45.1 44.0 - 60.0 mmHg   pO2, Ven 62.5 (H) 32.0 - 45.0 mmHg   Bicarbonate 24.9 20.0 - 28.0 mmol/L   Acid-Base Excess 1.4 0.0 - 2.0 mmol/L   O2  Saturation 89.9 %   Patient temperature 35.2    Collection site BLOOD RIGHT HAND    Drawn by 1517    Sample type VENOUS     Comment: Performed at [facility], [address], [place], [place] [address]  Blood Cultures (routine x 2)     Status: None (Preliminary result)   Collection Time: 05/08/21 10:56 PM   Specimen: Right Antecubital; Blood  Result Value Ref Range   Specimen Description      RIGHT ANTECUBITAL BOTTLES DRAWN AEROBIC AND ANAEROBIC   Special Requests      Blood Culture adequate volume Performed at [facility], [address] [place], [place] [address]    Culture PENDING    Report Status PENDING   Lactic acid, plasma     Status: None   Collection Time: 05/09/21 12:27 AM  Result Value Ref Range   Lactic Acid, Venous 1.6 0.5 - 1.9 mmol/L    Comment: Performed at [facility] [address], [place], [place] [address]  MRSA Next Gen by PCR, Nasal     Status: None   Collection Time: 05/09/21  1:47 AM   Specimen: Nasal Mucosa; Nasal Swab  Result Value Ref Range   MRSA by PCR Next Gen NOT DETECTED NOT DETECTED    Comment: (NOTE) The GeneXpert MRSA Assay (FDA approved for NASAL specimens only), is one component of a comprehensive MRSA colonization surveillance program. It is not intended to diagnose MRSA infection nor to guide or monitor treatment for MRSA infections. Test performance is not FDA approved in patients less than 3 years old. Performed at [facility], [address] [place],  [address]   CBC WITH DIFFERENTIAL     Status: Abnormal   Collection Time: 05/09/21  4:16 AM  Result Value Ref Range   WBC 9.9 4.0 - 10.5 K/uL   RBC 3.93 3.87 - 5.11 MIL/uL   Hemoglobin 11.6 (L) 12.0 - 15.0 g/dL   HCT 39.4 36.0 - 46.0 %   MCV 100.3 (H) 80.0 - 100.0 fL   MCH 29.5 26.0 - 34.0 pg   MCHC 29.4 (L) 30.0 - 36.0 g/dL   RDW 16.8 (H) 11.5 - 15.5 %   Platelets 113 (L) 150 - 400 K/uL    Comment:  SPECIMEN CHECKED FOR CLOTS   nRBC 0.0 0.0 - 0.2 %   Neutrophils  Relative % 72 %   Neutro Abs 7.2 1.7 - 7.7 K/uL   Lymphocytes Relative 17 %   Lymphs Abs 1.6 0.7 - 4.0 K/uL   Monocytes Relative 7 %   Monocytes Absolute 0.7 0.1 - 1.0 K/uL   Eosinophils Relative 3 %   Eosinophils Absolute 0.3 0.0 - 0.5 K/uL   Basophils Relative 0 %   Basophils Absolute 0.0 0.0 - 0.1 K/uL   Immature Granulocytes 1 %   Abs Immature Granulocytes 0.06 0.00 - 0.07 K/uL    Comment: Performed at [facility], [address] [place], [place] [address]    Chemistries  Recent Labs  Lab 05/08/21 2200  NA 143  K 4.4  CL 100  CO2 21*  GLUCOSE 118*  BUN 44*  CREATININE 10.64*  CALCIUM 8.5*  AST 22  ALT 19  ALKPHOS 158*  BILITOT 0.8   ------------------------------------------------------------------------------------------------------------------  ------------------------------------------------------------------------------------------------------------------ GFR: Estimated Creatinine Clearance: 6.5 mL/min (A) (by C-G formula based on SCr of 10.64 mg/dL (H)). Liver Function Tests: Recent Labs  Lab 05/08/21 2200  AST 22  ALT 19  ALKPHOS 158*  BILITOT 0.8  PROT 7.1  ALBUMIN 4.1   No results for input(s): LIPASE, AMYLASE in the last 168 hours. Recent Labs  Lab 05/08/21 2255  AMMONIA 24   Coagulation Profile: Recent Labs  Lab 05/08/21 2200  INR 1.0   Cardiac Enzymes: Recent Labs  Lab 05/08/21 2200  CKTOTAL 186   BNP (last 3 results) No results for input(s): PROBNP in the last 8760 hours. HbA1C: No results for input(s): HGBA1C in the last 72 hours. CBG: Recent Labs  Lab 05/08/21 2207  GLUCAP 110*   Lipid Profile: No results for input(s): CHOL, HDL, LDLCALC, TRIG, CHOLHDL, LDLDIRECT in the last 72 hours. Thyroid Function Tests: No results for input(s): TSH, T4TOTAL, FREET4, T3FREE, THYROIDAB in the last 72 hours. Anemia Panel: No results for input(s): VITAMINB12, FOLATE, FERRITIN, TIBC, IRON, RETICCTPCT in the last 72  hours.  --------------------------------------------------------------------------------------------------------------- Urine analysis: No results found for: COLORURINE, APPEARANCEUR, LABSPEC, PHURINE, GLUCOSEU, HGBUR, BILIRUBINUR, KETONESUR, PROTEINUR, UROBILINOGEN, NITRITE, LEUKOCYTESUR    Imaging Results:    DG Chest Port 1 View  Result Date: 05/08/2021 CLINICAL DATA:  Altered mental status EXAM: PORTABLE CHEST 1 VIEW COMPARISON:  04/12/2021 FINDINGS: Cardiac shadow is enlarged but stable. 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 The lungs are well aerated bilaterally. Mild central vascular congestion is seen. No bony abnormality is noted. Changes of prior vascular stenting are noted in the left shoulder region. IMPRESSION: Mild vascular congestion without interstitial edema. Electronically Signed   By: Inez Catalina M.D.   On: 05/08/2021 23:02   CT HEAD CODE STROKE WO CONTRAST  Addendum Date: 05/08/2021   ADDENDUM REPORT: 05/08/2021 23:38 ADDENDUM: These results were called by telephone at the time of interpretation on 05/08/2021 at 60:45 pm to Dr. Roxanne Mins, who verbally acknowledged these results. Electronically Signed   By: Ulyses Jarred M.D.   On: 05/08/2021 23:38   Result Date: 05/08/2021 CLINICAL DATA:  Code stroke.  Acute neurologic deficits EXAM: CT HEAD WITHOUT CONTRAST TECHNIQUE: Contiguous axial images were obtained from the base of the skull through the vertex without intravenous contrast. COMPARISON:  None. FINDINGS: Brain: There is no mass, hemorrhage or extra-axial collection. The size and configuration of the ventricles and extra-axial CSF spaces are normal. The brain parenchyma is normal, without evidence of acute or chronic  infarction. Vascular: No abnormal hyperdensity of the major intracranial arteries or dural venous sinuses. No intracranial atherosclerosis. Skull: The visualized skull base, calvarium and extracranial soft tissues are normal. Sinuses/Orbits: No fluid levels or  advanced mucosal thickening of the visualized paranasal sinuses. No mastoid or middle ear effusion. The orbits are normal. ASPECTS [facility] Stroke Program Early CT Score) - Ganglionic level infarction (caudate, lentiform nuclei, internal capsule, insula, M1-M3 cortex): 7 - Supraganglionic infarction (M4-M6 cortex): 3 Total score (0-10 with 10 being normal): 10 IMPRESSION: 1. Normal head CT. 2. ASPECTS is 10. Electronically Signed: By: Ulyses Jarred M.D. On: 05/08/2021 23:33    My personal review of EKG: Shows sinus tachycardia rate 102 without acute ST changes   Assessment & Plan:    Principal Problem:   Acute metabolic encephalopathy Active Problems:   Essential hypertension   ESRD (end stage renal disease) (HCC)   Acute metabolic encephalopathy With negative work-up in the ER including normal head CT, Exercise and chest x-ray, normal ammonia, VBG is unremarkable, electrolytes are relatively normal for ESRD patient Most likely causes polypharmacy with recently having been started on baclofen, already taking Lyrica, and gabapentin and accidentally taking Compazine today Hold gabapentin, Compazine, Lyrica, baclofen Also concerned with lower extremity cellulitis given leukocytosis, lactic acidosis, left lower extremity tenderness Start Rocephin, procalcitonin pending Blood cultures drawn N.p.o. due to obtunded state EEG in the a.m. for altered mental status ESRD Consult nephro for electrolyte management and routine dialysis Holding Cinacalcet due to mild hypocalcemia with normal albumin Essential hypertension  Continue amlodipine A. Fib Currently in sinus rhythm Continue amiodarone and Eliquis Med list will need to be confirmed on day shift Left lower extremity tenderness Continue Rocephin for possible cellulitis Ultrasound DVT   DVT Prophylaxis-   Eliquis  AM Labs Ordered, also please review Full Orders  Family Communication: No family at bedside  Code Status:  Full  Admission status: Observation Disposition: Anticipated Discharge date 24 to 48 hours discharge to home  Time spent in minutes : [place] DO

## 2021-05-09 NOTE — Progress Notes (Signed)
Patient seen and examined.  Admitted after midnight secondary to altered mental status.  At baseline with history of end-stage renal disease last hemodialysis on 05/07/2021; work-up suggesting polypharmacy at the main cause for altered mentation.  There is no acute intracranial normalities appreciated on CT head, no source of acute infection in her x-ray and recent history of taking gabapentin, Compazine, Lyrica and newly prescribed baclofen.  Please refer to H&P written by Dr. Clearence Ped for further info/details on admission.   Plan: -Continue supportive care and close reorientation -Minimize medications that can alter mentation and cause sedation. -Nephrology service has been consulted for resumption of hemodialysis. -Based on physical examination findings there is concern for lower extremity cellulitis, empirical antibiotics using Rocephin has been initiated.  Will maintain the leg elevated and follow response.  Barton Dubois MD [phone]

## 2021-05-09 NOTE — Plan of Care (Signed)
°  Problem: Acute Rehab OT Goals (only OT should resolve) Goal: Pt. Will Perform Eating Flowsheets (Taken 05/09/2021 1000) Pt Will Perform Eating:  with modified independence  sitting Goal: Pt. Will Perform Grooming Flowsheets (Taken 05/09/2021 1000) Pt Will Perform Grooming:  with min guard assist  standing Goal: Pt. Will Perform Upper Body Dressing Flowsheets (Taken 05/09/2021 1000) Pt Will Perform Upper Body Dressing:  with supervision  sitting Goal: Pt. Will Transfer To Toilet Flowsheets (Taken 05/09/2021 1000) Pt Will Transfer to Toilet:  with min assist  stand pivot transfer  ambulating  regular height toilet  bedside commode Goal: Pt. Will Perform Toileting-Clothing Manipulation Flowsheets (Taken 05/09/2021 1000) Pt Will Perform Toileting - Clothing Manipulation and hygiene:  with supervision  sitting/lateral leans  sit to/from stand Goal: Pt/Caregiver Will Perform Home Exercise Program Flowsheets (Taken 05/09/2021 1000) Pt/caregiver will Perform Home Exercise Program:  Increased strength  Both right and left upper extremity  With written HEP provided  Independently

## 2021-05-09 NOTE — Plan of Care (Signed)
°  Problem: Acute Rehab PT Goals(only PT should resolve) Goal: Pt Will Go Supine/Side To Sit Outcome: Progressing Flowsheets (Taken 05/09/2021 1144) Pt will go Supine/Side to Sit: with min guard assist Goal: Pt Will Go Sit To Supine/Side Outcome: Progressing Flowsheets (Taken 05/09/2021 1144) Pt will go Sit to Supine/Side: with min guard assist Goal: Patient Will Transfer Sit To/From Stand Outcome: Progressing Flowsheets (Taken 05/09/2021 1144) Patient will transfer sit to/from stand: with minimal assist Goal: Pt Will Transfer Bed To Chair/Chair To Bed Outcome: Progressing Flowsheets (Taken 05/09/2021 1144) Pt will Transfer Bed to Chair/Chair to Bed: with min assist Goal: Pt Will Ambulate Outcome: Progressing Flowsheets (Taken 05/09/2021 1144) Pt will Ambulate:  25 feet  with min guard assist  with rolling walker  11:44 AM, 05/09/21 M. Sherlyn Lees, PT, DPT Physical Therapist- [place] Office Number: [phone]

## 2021-05-09 NOTE — Evaluation (Signed)
Occupational Therapy Evaluation Patient Details Name: Leslie Wilcox MRN: [id number] DOB: [birth year] Today's Date: 05/09/2021   History of Present Illness Leslie Wilcox  is a 65 y.o. female, with history of CHF, COPD, coronary artery disease, ESRD, hypertension, with complaint of altered mental status.  Patient is obtunded and does not provide any history.  Chart review reveals the patient was recently prescribed baclofen for pain in her left lower extremity.  Family reported to the ED that every time she takes this medication she becomes confused.  She was last seen normal around 2 PM on Christmas Day.  She took a nap and when she woke up she was very altered.  She cannot answer questions and seemed agitated.  Family also noted loose and shaking spells.  They noted her speech to be slurred and erratic, and answering questions inappropriately. CT negative for acute infarct.   Clinical Impression   Pt agreeable to OT/PT co-evaluation this am, increased time to answer questions however able to answer appropriately and give PLOF information. Pt requiring mod to max assist for ADLs at this time with inability to stand for tasks and limited sitting balance. Pt reports/demonstrates high pain levels in LLE upon lying down, repositioned to right sidelying with pillows in place with improvements in pain, pt resting comfortably at end of session. Recommend SNF on discharge to improve pt safety and independence in ADL completion and mobility tasks.       Recommendations for follow up therapy are one component of a multi-disciplinary discharge planning process, led by the attending physician.  Recommendations may be updated based on patient status, additional functional criteria and insurance authorization.   Follow Up Recommendations  Skilled nursing-short term rehab (<3 hours/day)    Assistance Recommended at Discharge Frequent or constant Supervision/Assistance  Functional Status Assessment  Patient has  had a recent decline in their functional status and demonstrates the ability to make significant improvements in function in a reasonable and predictable amount of time.  Equipment Recommendations  None recommended by OT       Precautions / Restrictions Precautions Precautions: Fall Restrictions Weight Bearing Restrictions: No      Mobility Bed Mobility Overal bed mobility: Needs Assistance Bed Mobility: Supine to Sit;Sit to Supine     Supine to sit: Mod assist;Max assist;HOB elevated Sit to supine: Max assist        Transfers Overall transfer level: Needs assistance                 General transfer comment: Defer to PT note          ADL either performed or assessed with clinical judgement   ADL Overall ADL's : Needs assistance/impaired Eating/Feeding: Set up;Sitting   Grooming: Set up;Sitting   Upper Body Bathing: Moderate assistance;Sitting;Bed level   Lower Body Bathing: Maximal assistance;Sitting/lateral leans;Bed level   Upper Body Dressing : Sitting;Maximal assistance   Lower Body Dressing: Maximal assistance;Sitting/lateral leans;Bed level   Toilet Transfer: Maximal assistance;Stand-pivot;BSC/3in1 Toilet Transfer Details (indicate cue type and reason): simulated with sit to stand Toileting- Clothing Manipulation and Hygiene: Maximal assistance;Sitting/lateral lean         General ADL Comments: Pt requiring increased assistance for all ADLs due to generalized weakness and LE pain limiting ability to stand and perform tasks. Also limited by dizziness and limited sitting tolerance     Vision Baseline Vision/History: 0 No visual deficits Ability to See in Adequate Light: 0 Adequate Patient Visual Report: No change from baseline Vision Assessment?: No apparent  visual deficits            Pertinent Vitals/Pain Pain Assessment: Faces Faces Pain Scale: Hurts whole lot Breathing: occasional labored breathing, short period of  hyperventilation Negative Vocalization: repeated troubled calling out, loud moaning/groaning, crying Facial Expression: facial grimacing Body Language: tense, distressed pacing, fidgeting Consolability: distracted or reassured by voice/touch PAINAD Score: 7 Pain Location: LLE-thigh Pain Descriptors / Indicators: Crying;Discomfort;Grimacing;Moaning Pain Intervention(s): Limited activity within patient's tolerance;Monitored during session;Repositioned     Hand Dominance Right   Extremity/Trunk Assessment Upper Extremity Assessment Upper Extremity Assessment: Generalized weakness   Lower Extremity Assessment Lower Extremity Assessment: Defer to PT evaluation       Communication Communication Communication: No difficulties   Cognition Arousal/Alertness: Awake/alert Behavior During Therapy: WFL for tasks assessed/performed Overall Cognitive Status: Within Functional Limits for tasks assessed                                                  Home Living Family/patient expects to be discharged to:: Private residence Living Arrangements: Alone Available Help at Discharge: Family;Available PRN/intermittently Type of Home: House Home Access: Stairs to enter [employer] of Steps: 2 Entrance Stairs-Rails: Right;Left       Bathroom Shower/Tub: Occupational psychologist: Standard     Home Equipment: Conservation officer, nature (2 wheels)          Prior Functioning/Environment Prior Level of Function : Independent/Modified Independent             Mobility Comments: has RW per pt report ADLs Comments: independent per pt report        OT Problem List: Decreased strength;Decreased activity tolerance;Impaired balance (sitting and/or standing);Decreased safety awareness;Decreased knowledge of use of DME or AE;Pain      OT Treatment/Interventions: Self-care/ADL training;Therapeutic exercise;DME and/or AE instruction;Therapeutic  activities;Patient/family education    OT Goals(Current goals can be found in the care plan section) Acute Rehab OT Goals Patient Stated Goal: to have less pain and go home OT Goal Formulation: With patient Time For Goal Achievement: 05/23/21 Potential to Achieve Goals: Good  OT Frequency: Min 1X/week   Barriers to D/C: Decreased caregiver support  Pt reports husband cannot assist because he has cancer       Co-evaluation PT/OT/SLP Co-Evaluation/Treatment: Yes Reason for Co-Treatment: Complexity of the patient's impairments (multi-system involvement);For patient/therapist safety;To address functional/ADL transfers   OT goals addressed during session: ADL's and self-care      AM-PAC OT "6 Clicks" Daily Activity     Outcome Measure Help from another person eating meals?: A Little Help from another person taking care of personal grooming?: A Lot Help from another person toileting, which includes using toliet, bedpan, or urinal?: A Lot Help from another person bathing (including washing, rinsing, drying)?: A Lot Help from another person to put on and taking off regular upper body clothing?: A Lot Help from another person to put on and taking off regular lower body clothing?: A Lot 6 Click Score: 13   End of Session Equipment Utilized During Treatment: Rolling walker (2 wheels);Oxygen  Activity Tolerance: Patient limited by fatigue;Patient limited by pain Patient left: in bed;with call bell/phone within reach;with bed alarm set  OT Visit Diagnosis: Muscle weakness (generalized) (M62.81)                Time: 6962-9528 OT Time Calculation (min): 16 min  Charges:  OT General Charges $OT Visit: 1 Visit OT Evaluation $OT Eval Low Complexity: [place], OTR/L  [phone] 05/09/2021, 9:56 AM

## 2021-05-09 NOTE — ED Notes (Signed)
Pt was recently prescribed Baclofen and Diclofenac but pt also had in her medicine bag Compazine 10mg  tablets which belong to her husband. Granddaughter reports that she thinks pt has taken the Compazine along with Baclofen and Diclofenac. Granddaughter also brought the rest of her medicines and they were updated in Med Rec however compliance with medications cannot be verified. Dr. Darrell Jewel notified.

## 2021-05-09 NOTE — Procedures (Signed)
Patient Name: Leslie Wilcox  MRN: [id number]  Epilepsy Attending: Lora Havens  Referring Physician/Provider: Rolla Plate, DO Date: 05/09/2021 Duration: 22.19 mins  Patient history: 65yo F with ams. EEG to evaluate for seizure  Level of alertness: awake  AEDs during EEG study: None  Technical aspects: This EEG study was done with scalp electrodes positioned according to the 10-20 International system of electrode placement. Electrical activity was acquired at a sampling rate of 500Hz  and reviewed with a high frequency filter of 70Hz  and a low frequency filter of 1Hz . EEG data were recorded continuously and digitally stored.   Description:EEG showed continuous generalized polymorphic sharply contoured 3 to 6 Hz theta-delta slowing. Generalized periodic discharges with triphasic morphology at  1Hz  were also noted. Hyperventilation and photic stimulation were not performed.     ABNORMALITY - Periodic discharges with triphasic morphology, generalized ( GPDs) - Continuous slow, generalized  IMPRESSION: This study showed generalized periodic discharges with triphasic morphology at  1Hz  which is on the ictal-interictal continuum with low potential for seizures. This eeg pattern can also be seen due to toxic-metabolic causes like hyperammonemia, cefepime toxicity.  Addditionally, EEG is suggestive of moderate diffuse encephalopathy, nonspecific etiology.  No seizures were seen throughout the recording.  Jeffifer Rabold Barbra Sarks

## 2021-05-09 NOTE — Progress Notes (Signed)
EEG complete - results pending 

## 2021-05-09 NOTE — TOC Initial Note (Signed)
Transition of Care [facility]) - Initial/Assessment Note    Patient Details  Name: Leslie Wilcox MRN: [id number] Date of Birth: [birth year]  [facility] CM/SW Contact:    Salome Arnt, [place] Phone Number: 05/09/2021, 3:45 PM  Clinical Narrative:  Pt admitted due to acute metabolic encephalopathy. Assessment completed with granddaughter as pt oriented to self only per chart. Pt's granddaughter reports pt lives with her and pt's niece. Granddaughter is CAP aid and provides around the clock care. She indicates pt requires extensive assist with all ADLs. She is on 2L home O2 through [place]. Pt ambulates with walker at baseline. Discussed PT eval recommending SNF. Granddaughter states pt would be "very upset" if she went to SNF. She requests return home, but wants to see how pt progresses during hospitalization. Pt is on TTS schedule for dialysis at [facility] and granddaughter transports. She may need wheelchair at d/c. TOC will continue to follow.                    Barriers to Discharge: Continued Medical Work up   Patient Goals and CMS Choice Patient states their goals for this hospitalization and ongoing recovery are:: return home      Expected Discharge Plan and Services   In-house Referral: Clinical Social Work     Living arrangements for the past 2 months: Single Family Home                                      Prior Living Arrangements/Services Living arrangements for the past 2 months: Single Family Home Lives with:: Relatives Patient language and need for interpreter reviewed:: Yes Do you feel safe going back to the place where you live?: Yes      Need for Family Participation in Patient Care: Yes (Comment) Care giver support system in place?: Yes (comment) Current home services: DME (home O2, walker) Criminal Activity/Legal Involvement Pertinent to Current Situation/Hospitalization: No - Comment as needed  Activities of Daily  Living Home Assistive Devices/Equipment: Walker (specify type), Oxygen ADL Screening (condition at time of admission) Patient's cognitive ability adequate to safely complete daily activities?: No Is the patient deaf or have difficulty hearing?: No Does the patient have difficulty seeing, even when wearing glasses/contacts?: No Does the patient have difficulty concentrating, remembering, or making decisions?: Yes Patient able to express need for assistance with ADLs?: No Does the patient have difficulty dressing or bathing?: Yes Independently performs ADLs?: No Communication: Needs assistance Is this a change from baseline?: Change from baseline, expected to last <3 days Dressing (OT): Needs assistance, Dependent Is this a change from baseline?: Change from baseline, expected to last <3days Grooming: Dependent Is this a change from baseline?: Change from baseline, expected to last <3 days Feeding: Needs assistance Is this a change from baseline?: Change from baseline, expected to last <3 days Bathing: Dependent Is this a change from baseline?: Change from baseline, expected to last <3 days Toileting: Dependent Is this a change from baseline?: Change from baseline, expected to last <3 days In/Out Bed: Dependent Is this a change from baseline?: Change from baseline, expected to last <3 days Walks in Home: Needs assistance Is this a change from baseline?: Pre-admission baseline Does the patient have difficulty walking or climbing stairs?: Yes Weakness of Legs: Both Weakness of Arms/Hands: Both  Permission Sought/Granted  Emotional Assessment   Attitude/Demeanor/Rapport: Unable to Assess Affect (typically observed): Unable to Assess Orientation: : Oriented to Self Alcohol / Substance Use: Not Applicable Psych Involvement: No (comment)  Admission diagnosis:  Left leg pain [M79.605] End-stage renal disease on hemodialysis ([place]) [N18.6, Z99.2] Elevated alkaline  phosphatase level [R74.8] Altered mental status, unspecified altered mental status type [V12.92] Acute metabolic encephalopathy [T09.03] AMS (altered mental status) [R41.82] Patient Active Problem List   Diagnosis Date Noted   ESRD (end stage renal disease) ([place]) 01/49/9692   Acute metabolic encephalopathy 49/32/4199   CAD (coronary artery disease) 12/12/2015   COPD (chronic obstructive pulmonary disease) ([place]) 12/12/2015   Essential hypertension 12/12/2015   CKD (chronic kidney disease) requiring chronic dialysis ([place]) 12/09/2015   Hyperkalemia 12/09/2015   PCP:  Pcp, No Pharmacy:  No Pharmacies Listed    Social Determinants of Health (SDOH) Interventions    Readmission Risk Interventions No flowsheet data found.

## 2021-05-09 NOTE — Consult Note (Signed)
Reason for Consult: Continuity of ESRD care Referring Physician: Barton Dubois MD [facility])  HPI:  65 year old woman with past medical history significant for hypertension, coronary artery disease, history of congestive heart failure, chronic obstructive lung disease, atrial fibrillation on anticoagulation with Eliquis and end-stage renal disease on hemodialysis on a TTS schedule who was admitted yesterday for altered mental status noticed by family members.  Her work-up so far suggests that this is polypharmacy induced after being started on baclofen in addition to "pain medications".  When seen in the ICU, she awakens to calling out her name and is able to answer basic questions but has problems with recall and detailed questions.  She is disoriented to time and place and is vaguely oriented to person.  She denies any shortness of breath with ongoing oxygen supplementation via nasal cannula and does not have any chest pain.  She denies any preceding nausea, vomiting or diarrhea.  She recalls that she gets hemodialysis on a TTS schedule (4 hours) in [place] but does not recall the name of her nephrologist or her estimated dry weight.  Past Medical History:  Diagnosis Date   Arthritis    Asthma    CHF (congestive heart failure) (HCC)    COPD (chronic obstructive pulmonary disease) (HCC)    Coronary artery disease    Dialysis patient [facility])    Hypertension    Renal disorder     History reviewed. No pertinent surgical history.  History reviewed. No pertinent family history.  Social History:  reports that she has never smoked. She has never used smokeless tobacco. She reports that she does not currently use alcohol. She reports that she does not use drugs.  Allergies:  Allergies  Allergen Reactions   Amoxicillin    Asa [Aspirin]    Gabapentin    Iodinated Contrast Media    Shellfish Allergy     Medications: Scheduled:  amiodarone  200 mg Oral Daily   amLODipine  5 mg Oral Daily    apixaban  2.5 mg Oral BID   aspirin  81 mg Oral Daily   atorvastatin  20 mg Oral Daily   budesonide (PULMICORT) nebulizer solution  0.25 mg Nebulization BID   Chlorhexidine Gluconate Cloth  6 each Topical Daily   clopidogrel  75 mg Oral Daily   DULoxetine  30 mg Oral Daily   famotidine  10 mg Oral Daily   ferric citrate  210 mg Oral TID WC   pantoprazole (PROTONIX) IV  40 mg Intravenous Q24H   umeclidinium bromide  1 puff Inhalation Daily    BMP Latest Ref Rng & Units 05/09/2021 05/08/2021  Glucose 70 - 99 mg/dL 97 118(H)  BUN 8 - 23 mg/dL 46(H) 44(H)  Creatinine 0.44 - 1.00 mg/dL 11.37(H) 10.64(H)  Sodium 135 - 145 mmol/L 144 143  Potassium 3.5 - 5.1 mmol/L 4.6 4.4  Chloride 98 - 111 mmol/L 102 100  CO2 22 - 32 mmol/L 21(L) 21(L)  Calcium 8.9 - 10.3 mg/dL 8.1(L) 8.5(L)   CBC Latest Ref Rng & Units 05/09/2021 05/08/2021  WBC 4.0 - 10.5 K/uL 9.9 13.6(H)  Hemoglobin 12.0 - 15.0 g/dL 11.6(L) 12.6  Hematocrit 36.0 - 46.0 % 39.4 40.5  Platelets 150 - 400 K/uL 113(L) 186     US Venous Img Lower Unilateral Left (DVT)  Result Date: 05/09/2021 CLINICAL DATA:  Left lower extremity pain. The patient is currently on anticoagulation. Evaluate for DVT. EXAM: LEFT LOWER EXTREMITY VENOUS DOPPLER ULTRASOUND TECHNIQUE: Gray-scale sonography with graded compression,  as well as color Doppler and duplex ultrasound were performed to evaluate the lower extremity deep venous systems from the level of the common femoral vein and including the common femoral, femoral, profunda femoral, popliteal and calf veins including the posterior tibial, peroneal and gastrocnemius veins when visible. The superficial great saphenous vein was also interrogated. Spectral Doppler was utilized to evaluate flow at rest and with distal augmentation maneuvers in the common femoral, femoral and popliteal veins. COMPARISON:  None. FINDINGS: Contralateral Common Femoral Vein: Respiratory phasicity is normal and symmetric with the  symptomatic side. No evidence of thrombus. Normal compressibility. Common Femoral Vein: No evidence of thrombus. Normal compressibility, respiratory phasicity and response to augmentation. Saphenofemoral Junction: No evidence of thrombus. Normal compressibility and flow on color Doppler imaging. Profunda Femoral Vein: No evidence of thrombus. Normal compressibility and flow on color Doppler imaging. Femoral Vein: No evidence of thrombus. Normal compressibility, respiratory phasicity and response to augmentation. Popliteal Vein: No evidence of thrombus. Normal compressibility, respiratory phasicity and response to augmentation. Calf Veins: No evidence of thrombus. Normal compressibility and flow on color Doppler imaging. Superficial Great Saphenous Vein: No evidence of thrombus. Normal compressibility. Venous Reflux:  None. Other Findings:  None. IMPRESSION: No evidence of DVT within the left lower extremity. Electronically Signed   By: Sandi Mariscal M.D.   On: 05/09/2021 11:04   DG Chest Port 1 View  Result Date: 05/08/2021 CLINICAL DATA:  Altered mental status EXAM: PORTABLE CHEST 1 VIEW COMPARISON:  04/12/2021 FINDINGS: Cardiac shadow is enlarged but stable. 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 The lungs are well aerated bilaterally. Mild central vascular congestion is seen. No bony abnormality is noted. Changes of prior vascular stenting are noted in the left shoulder region. IMPRESSION: Mild vascular congestion without interstitial edema. Electronically Signed   By: Inez Catalina M.D.   On: 05/08/2021 23:02   CT HEAD CODE STROKE WO CONTRAST  Addendum Date: 05/08/2021   ADDENDUM REPORT: 05/08/2021 23:38 ADDENDUM: These results were called by telephone at the time of interpretation on 05/08/2021 at 77:82 pm to Dr. Roxanne Mins, who verbally acknowledged these results. Electronically Signed   By: Ulyses Jarred M.D.   On: 05/08/2021 23:38   Result Date: 05/08/2021 CLINICAL DATA:  Code stroke.  Acute neurologic  deficits EXAM: CT HEAD WITHOUT CONTRAST TECHNIQUE: Contiguous axial images were obtained from the base of the skull through the vertex without intravenous contrast. COMPARISON:  None. FINDINGS: Brain: There is no mass, hemorrhage or extra-axial collection. The size and configuration of the ventricles and extra-axial CSF spaces are normal. The brain parenchyma is normal, without evidence of acute or chronic infarction. Vascular: No abnormal hyperdensity of the major intracranial arteries or dural venous sinuses. No intracranial atherosclerosis. Skull: The visualized skull base, calvarium and extracranial soft tissues are normal. Sinuses/Orbits: No fluid levels or advanced mucosal thickening of the visualized paranasal sinuses. No mastoid or middle ear effusion. The orbits are normal. ASPECTS [facility] Stroke Program Early CT Score) - Ganglionic level infarction (caudate, lentiform nuclei, internal capsule, insula, M1-M3 cortex): 7 - Supraganglionic infarction (M4-M6 cortex): 3 Total score (0-10 with 10 being normal): 10 IMPRESSION: 1. Normal head CT. 2. ASPECTS is 10. Electronically Signed: By: Ulyses Jarred M.D. On: 05/08/2021 23:33    Review of Systems  Unable to perform ROS: Mental status change (Unreliable with altered mental status)  Blood pressure (!) 175/52, pulse 84, temperature 97.6 F (36.4 C), temperature source Oral, resp. rate (!) 8, height 5\' 4"  (1.626 m), weight  107.7 kg, SpO2 100 %. Physical Exam Vitals and nursing note reviewed.  Constitutional:      General: She is not in acute distress.    Appearance: Normal appearance. She is obese. She is not ill-appearing.     Comments: Not in distress, awakens to calling out her name  HENT:     Head: Normocephalic and atraumatic.     Right Ear: External ear normal.     Left Ear: External ear normal.     Nose: Nose normal.     Mouth/Throat:     Mouth: Mucous membranes are dry.     Pharynx: Oropharynx is clear.  Eyes:     Extraocular  Movements: Extraocular movements intact.     Conjunctiva/sclera: Conjunctivae normal.  Cardiovascular:     Rate and Rhythm: Normal rate and regular rhythm.     Pulses: Normal pulses.     Heart sounds: Normal heart sounds. No murmur heard. Pulmonary:     Effort: Pulmonary effort is normal.     Breath sounds: Normal breath sounds. No wheezing or rales.  Abdominal:     General: Abdomen is flat. Bowel sounds are normal.     Palpations: Abdomen is soft.  Musculoskeletal:     Cervical back: Normal range of motion and neck supple. No rigidity.     Right lower leg: No edema.     Left lower leg: No edema.     Comments: Left upper arm brachiocephalic fistula-aneurysmal  Skin:    General: Skin is warm and dry.  Neurological:     Mental Status: She is disoriented.    Assessment/Plan: 1.  Altered mental status: Likely secondary to polypharmacy including recently started baclofen use in this patient with ESRD.  She has had some improvement of mental status overnight likely reflective of ongoing drug metabolic syndrome and will have additional benefit following dialysis tomorrow with extracorporeal clearance.  She does not have any acute indications for dialysis at this time as she is able to protect her airway. 2.  End-stage renal disease: Continue hemodialysis on TTS schedule with next hemodialysis treatment ordered for tomorrow.  She has a functional left upper arm brachiocephalic fistula.  She appears to be euvolemic on physical exam and labs do not show any critical electrolyte abnormality. 3.  Hypertension: Resume antihypertensive therapy with continued monitoring in the setting of hemodialysis. 4.  CKD-MBD: Continue Auryxia with meals for phosphorus binding, renal diet per primary service. 5.  Chronic atrial fibrillation: She currently appears to be in sinus rhythm and rate controlled with ongoing amiodarone and Eliquis.  Vaughan Garfinkle K. 05/09/2021, 1:05 PM

## 2021-05-10 DIAGNOSIS — M79605 Pain in left leg: Secondary | ICD-10-CM | POA: Diagnosis not present

## 2021-05-10 DIAGNOSIS — Z992 Dependence on renal dialysis: Secondary | ICD-10-CM

## 2021-05-10 DIAGNOSIS — N186 End stage renal disease: Secondary | ICD-10-CM | POA: Diagnosis not present

## 2021-05-10 DIAGNOSIS — Z87448 Personal history of other diseases of urinary system: Secondary | ICD-10-CM

## 2021-05-10 DIAGNOSIS — I10 Essential (primary) hypertension: Secondary | ICD-10-CM

## 2021-05-10 DIAGNOSIS — J449 Chronic obstructive pulmonary disease, unspecified: Secondary | ICD-10-CM

## 2021-05-10 DIAGNOSIS — K219 Gastro-esophageal reflux disease without esophagitis: Secondary | ICD-10-CM

## 2021-05-10 DIAGNOSIS — G9341 Metabolic encephalopathy: Secondary | ICD-10-CM | POA: Diagnosis not present

## 2021-05-10 DIAGNOSIS — M170 Bilateral primary osteoarthritis of knee: Secondary | ICD-10-CM

## 2021-05-10 DIAGNOSIS — Z862 Personal history of diseases of the blood and blood-forming organs and certain disorders involving the immune mechanism: Secondary | ICD-10-CM

## 2021-05-10 DIAGNOSIS — J9611 Chronic respiratory failure with hypoxia: Secondary | ICD-10-CM

## 2021-05-10 LAB — HEPATITIS B SURFACE ANTIBODY,QUALITATIVE: Hep B S Ab: NONREACTIVE

## 2021-05-10 MED ORDER — HEPARIN SODIUM (PORCINE) 1000 UNIT/ML DIALYSIS: 40.0000 [IU]/kg | INTRAMUSCULAR | Status: DC | PRN

## 2021-05-10 MED ORDER — PENTAFLUOROPROP-TETRAFLUOROETH EX AERO: 1.0000 "application " | INHALATION_SPRAY | CUTANEOUS | Status: DC | PRN

## 2021-05-10 MED ORDER — DICLOFENAC SODIUM 1 % EX GEL: CUTANEOUS | 1 refills | Status: AC

## 2021-05-10 MED ORDER — OXYCODONE HCL 5 MG PO TABS: 5.0000 mg | ORAL_TABLET | Freq: Three times a day (TID) | ORAL | 0 refills | Status: AC | PRN

## 2021-05-10 MED ORDER — SODIUM CHLORIDE 0.9 % IV SOLN: 100.0000 mL | INTRAVENOUS | Status: DC | PRN

## 2021-05-10 MED ORDER — LIDOCAINE-PRILOCAINE 2.5-2.5 % EX CREA: 1.0000 "application " | TOPICAL_CREAM | CUTANEOUS | Status: DC | PRN

## 2021-05-10 MED ORDER — LIDOCAINE HCL (PF) 1 % IJ SOLN: 5.0000 mL | INTRAMUSCULAR | Status: DC | PRN

## 2021-05-10 NOTE — Procedures (Signed)
° °  HEMODIALYSIS TREATMENT NOTE:   Difficult cannulation of venous end of AVF.  She has a soft hematoma from a recent infiltration.  Max Qb 400.  UF was limited by hypotension but we were able to remove 1.5 liters.  All blood was returned and hemostasis was achieved in 20 minutes.  No changes from pre-HD assessment.  Rockwell Alexandria, RN

## 2021-05-10 NOTE — Progress Notes (Signed)
[facility] NEPHROLOGY PROGRESS NOTE  Assessment/ Plan: Pt is a 65 y.o. yo female with history of HTN, CAD, CHF, COPD, A. fib, ESRD on HD TTS admitted for altered mental status thought to be due to polypharmacy.  #Altered mental status likely due to polypharmacy including pain medication, baclofen etc.  EEG consistent with toxic/metabolic causes without evidence of seizure.  Her mental status seems to have improved.  Please avoid baclofen in this patient.  # ESRD TTS at [facility] in [place]: Continue dialysis today as per her regular schedule.  She has functional left upper extremity AV fistula present.  I will discuss with dialysis nurse.  # Anemia of ESRD: Hemoglobin 11.6, at goal.  # Secondary hyperparathyroidism: Currently on Auryxia.  Monitor calcium phosphorus level.  # HTN/volume: Blood pressure acceptable.  Continue current antihypertensive and volume management with dialysis.  Discussed with the primary team.  Subjective: Seen and examined at bedside.  Patient was concerned about knee pain.  Denies nausea, vomiting, chest pain, shortness of breath.  No new event. Objective Vital signs in last 24 hours: Vitals:   05/10/21 0340 05/10/21 0500 05/10/21 0724 05/10/21 0756  BP: (!) 158/71   (!) 142/74  Pulse: 80   88  Resp: 12   17  Temp: 98.4 F (36.9 C)   98.7 F (37.1 C)  TempSrc: Oral   Oral  SpO2: 100%  100% 99%  Weight:  109.6 kg    Height:  5\' 4"  (1.626 m)     Weight change: 1.9 kg  Intake/Output Summary (Last 24 hours) at 05/10/2021 0906 Last data filed at 05/09/2021 2000 Gross per 24 hour  Intake 50 ml  Output --  Net 50 ml       Labs: Basic Metabolic Panel: Recent Labs  Lab 05/08/21 2200 05/09/21 0416  NA 143 144  K 4.4 4.6  CL 100 102  CO2 21* 21*  GLUCOSE 118* 97  BUN 44* 46*  CREATININE 10.64* 11.37*  CALCIUM 8.5* 8.1*   Liver Function Tests: Recent Labs  Lab 05/08/21 2200 05/09/21 0416  AST 22 17  ALT 19 16  ALKPHOS  158* 136*  BILITOT 0.8 0.5  PROT 7.1 6.2*  ALBUMIN 4.1 3.6   No results for input(s): LIPASE, AMYLASE in the last 168 hours. Recent Labs  Lab 05/08/21 2255  AMMONIA 24   CBC: Recent Labs  Lab 05/08/21 2200 05/09/21 0416  WBC 13.6* 9.9  NEUTROABS 9.1* 7.2  HGB 12.6 11.6*  HCT 40.5 39.4  MCV 96.7 100.3*  PLT 186 113*   Cardiac Enzymes: Recent Labs  Lab 05/08/21 2200  CKTOTAL 186   CBG: Recent Labs  Lab 05/08/21 2207  GLUCAP 110*    Iron Studies: No results for input(s): IRON, TIBC, TRANSFERRIN, FERRITIN in the last 72 hours. Studies/Results: US Venous Img Lower Unilateral Left (DVT)  Result Date: 05/09/2021 CLINICAL DATA:  Left lower extremity pain. The patient is currently on anticoagulation. Evaluate for DVT. EXAM: LEFT LOWER EXTREMITY VENOUS DOPPLER ULTRASOUND TECHNIQUE: Gray-scale sonography with graded compression, as well as color Doppler and duplex ultrasound were performed to evaluate the lower extremity deep venous systems from the level of the common femoral vein and including the common femoral, femoral, profunda femoral, popliteal and calf veins including the posterior tibial, peroneal and gastrocnemius veins when visible. The superficial great saphenous vein was also interrogated. Spectral Doppler was utilized to evaluate flow at rest and with distal augmentation maneuvers in the common femoral, femoral and popliteal  veins. COMPARISON:  None. FINDINGS: Contralateral Common Femoral Vein: Respiratory phasicity is normal and symmetric with the symptomatic side. No evidence of thrombus. Normal compressibility. Common Femoral Vein: No evidence of thrombus. Normal compressibility, respiratory phasicity and response to augmentation. Saphenofemoral Junction: No evidence of thrombus. Normal compressibility and flow on color Doppler imaging. Profunda Femoral Vein: No evidence of thrombus. Normal compressibility and flow on color Doppler imaging. Femoral Vein: No evidence of  thrombus. Normal compressibility, respiratory phasicity and response to augmentation. Popliteal Vein: No evidence of thrombus. Normal compressibility, respiratory phasicity and response to augmentation. Calf Veins: No evidence of thrombus. Normal compressibility and flow on color Doppler imaging. Superficial Great Saphenous Vein: No evidence of thrombus. Normal compressibility. Venous Reflux:  None. Other Findings:  None. IMPRESSION: No evidence of DVT within the left lower extremity. Electronically Signed   By: Sandi Mariscal M.D.   On: 05/09/2021 11:04   DG Chest Port 1 View  Result Date: 05/08/2021 CLINICAL DATA:  Altered mental status EXAM: PORTABLE CHEST 1 VIEW COMPARISON:  04/12/2021 FINDINGS: Cardiac shadow is enlarged but stable. 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 The lungs are well aerated bilaterally. Mild central vascular congestion is seen. No bony abnormality is noted. Changes of prior vascular stenting are noted in the left shoulder region. IMPRESSION: Mild vascular congestion without interstitial edema. Electronically Signed   By: Inez Catalina M.D.   On: 05/08/2021 23:02   EEG adult  Result Date: 05/09/2021 Lora Havens, MD     05/09/2021  3:28 PM Patient Name: Martinique Pizzimenti MRN: [id number] Epilepsy Attending: Lora Havens Referring Physician/Provider: Rolla Plate, DO Date: 05/09/2021 Duration: 22.19 mins Patient history: 65yo F with ams. EEG to evaluate for seizure Level of alertness: awake AEDs during EEG study: None Technical aspects: This EEG study was done with scalp electrodes positioned according to the 10-20 International system of electrode placement. Electrical activity was acquired at a sampling rate of 500Hz  and reviewed with a high frequency filter of 70Hz  and a low frequency filter of 1Hz . EEG data were recorded continuously and digitally stored. Description:EEG showed continuous generalized polymorphic sharply contoured 3 to 6 Hz theta-delta slowing. Generalized  periodic discharges with triphasic morphology at  1Hz  were also noted. Hyperventilation and photic stimulation were not performed.   ABNORMALITY - Periodic discharges with triphasic morphology, generalized ( GPDs) - Continuous slow, generalized IMPRESSION: This study showed generalized periodic discharges with triphasic morphology at  1Hz  which is on the ictal-interictal continuum with low potential for seizures. This eeg pattern can also be seen due to toxic-metabolic causes like hyperammonemia, cefepime toxicity. Addditionally, EEG is suggestive of moderate diffuse encephalopathy, nonspecific etiology. No seizures were seen throughout the recording. Lora Havens   CT HEAD CODE STROKE WO CONTRAST  Addendum Date: 05/08/2021   ADDENDUM REPORT: 05/08/2021 23:38 ADDENDUM: These results were called by telephone at the time of interpretation on 05/08/2021 at 29:52 pm to Dr. Roxanne Mins, who verbally acknowledged these results. Electronically Signed   By: Ulyses Jarred M.D.   On: 05/08/2021 23:38   Result Date: 05/08/2021 CLINICAL DATA:  Code stroke.  Acute neurologic deficits EXAM: CT HEAD WITHOUT CONTRAST TECHNIQUE: Contiguous axial images were obtained from the base of the skull through the vertex without intravenous contrast. COMPARISON:  None. FINDINGS: Brain: There is no mass, hemorrhage or extra-axial collection. The size and configuration of the ventricles and extra-axial CSF spaces are normal. The brain parenchyma is normal, without evidence of acute or chronic infarction. Vascular: No  abnormal hyperdensity of the major intracranial arteries or dural venous sinuses. No intracranial atherosclerosis. Skull: The visualized skull base, calvarium and extracranial soft tissues are normal. Sinuses/Orbits: No fluid levels or advanced mucosal thickening of the visualized paranasal sinuses. No mastoid or middle ear effusion. The orbits are normal. ASPECTS [facility] Stroke Program Early CT Score) - Ganglionic level  infarction (caudate, lentiform nuclei, internal capsule, insula, M1-M3 cortex): 7 - Supraganglionic infarction (M4-M6 cortex): 3 Total score (0-10 with 10 being normal): 10 IMPRESSION: 1. Normal head CT. 2. ASPECTS is 10. Electronically Signed: By: Ulyses Jarred M.D. On: 05/08/2021 23:33    Medications: Infusions:  cefTRIAXone (ROCEPHIN)  IV 2 g (05/10/21 0212)    Scheduled Medications:  amiodarone  200 mg Oral Daily   amLODipine  5 mg Oral Daily   apixaban  2.5 mg Oral BID   aspirin  81 mg Oral Daily   atorvastatin  20 mg Oral Daily   budesonide (PULMICORT) nebulizer solution  0.25 mg Nebulization BID   Chlorhexidine Gluconate Cloth  6 each Topical Daily   clopidogrel  75 mg Oral Daily   DULoxetine  30 mg Oral Daily   famotidine  10 mg Oral Daily   ferric citrate  210 mg Oral TID WC   pantoprazole (PROTONIX) IV  40 mg Intravenous Q24H   umeclidinium bromide  1 puff Inhalation Daily    have reviewed scheduled and prn medications.  Physical Exam: General:NAD, comfortable Heart:RRR, s1s2 nl Lungs:clear b/l, no crackle Abdomen:soft, Non-tender,  Extremities: Left leg mildly swollen and no swelling on the right leg.  No tenderness. Dialysis Access: Left upper extremity AV fistula has good thrill and bruit.  Rana Hochstein Reesa Chew Tong Pieczynski 05/10/2021,9:06 AM  LOS: 1 day

## 2021-05-10 NOTE — Discharge Summary (Signed)
Physician Discharge Summary  Leslie Wilcox ZJI:[id number] DOB: [birth year] DOA: 05/08/2021  PCP: Pcp, No  Admit date: 05/08/2021 Discharge date: 05/10/2021  Time spent: 35 minutes  Recommendations for Outpatient Follow-up:  Reassess improvement inpatient lower extremity pain with prescribed analgesics. Reassess blood pressure and adjust antihypertensive regimen as needed. Goals of care and advance care planning discussion recommended.  Discharge Diagnoses:  Principal Problem:   Acute metabolic encephalopathy Active Problems:   Essential hypertension   End-stage renal disease on hemodialysis (HCC)   Left leg pain   Osteoarthritis of both knees   Gastroesophageal reflux disease   History of anemia of chronic renal failure   Chronic respiratory failure with hypoxia (HCC)   Discharge Condition: Stable and improved.  Mentation back to baseline.  Patient instructed to resume outpatient hemodialysis as previously scheduled and to follow-up with PCP in 10 days.  CODE STATUS: Full code.  Diet recommendation: Heart healthy/low-sodium diet.  Filed Weights   05/09/21 0153 05/10/21 0500 05/10/21 1315  Weight: 107.7 kg 109.6 kg 109.6 kg    History of present illness:  As per H&P written by Dr.Zierle-Ghosh on 05/09/21 Leslie Wilcox  is a 65 y.o. female, with history of CHF, COPD, coronary artery disease, ESRD, hypertension, with complaint of altered mental status.  Patient is obtunded and does not provide any history.  Chart review reveals the patient was recently prescribed baclofen for pain in her left lower extremity.  Family reported to the ED that every time she takes this medication she becomes confused.  She was last seen normal around 2 PM on Christmas Day.  She took a nap and when she woke up she was very altered.  She cannot answer questions and seemed agitated.  Family also noted loose and shaking spells.  They noted her speech to be slurred and erratic, and answering questions  inappropriately.  Patient is an ESRD patient.  Her last dialysis was on the 24th and she had 3 out of her 4 hours because she showed up late.   In the ED Temp 97.5, heart rate 82-102, respiratory rate 10-19, blood pressure 141/75 satting 100% pH 7.375 Ammonia 24 Slight leukocytosis at 13.6, hemoglobin 12.6 Chemistry panel reveals a decreased bicarb 21, increased BUN 44, increased creatinine 10.64 and ESRD patient Alk phos 158 Lactic acid 3.0 Negative respiratory panel Blood cultures pending Chest x-ray shows mild vascular congestion without interstitial edema CT head shows normal head CT Admission requested for further work-up and management of acute metabolic encephalopathy.  Hospital Course:  1-acute metabolic encephalopathy -In the setting of polypharmacy -Mentation fully improved and back to baseline after stopping offending agents and providing dialysis. -Avoid the use of baclofen at discharge. -EEG negative for seizure activity and suggesting encephalopathy pattern. -Patient's work-up demonstrated negative head CT, normal ammonia, unremarkable venous blood gas and a stable electrolytes for patient with renal failure.  2-end-stage renal disease -Well tolerated hemodialysis on 05/10/2021 -As previously scheduled in the outpatient setting. -Continue follow-up with nephrology.  3-essential hypertension -Resume home antihypertensive agents -Advised to follow heart healthy diet.  4-history of atrial fibrillation -Appears to be paroxysmal at baseline -Currently in sinus rhythm Continue amiodarone and Eliquis -Continue outpatient follow-up with cardiology service.  5-left lower extremity tenderness -On examination also found for patients to have pain in her right knee. -Negative procalcitonin, no erythematous changes, open wounds or elevated WBCs -Antibiotic has been discontinued. -Patient complains most likely in the setting of osteoarthritis -Pain medications follow  recommendations by nephrology prescribed at discharge;  patient will also continue the use of Voltaren gel and acetaminophen.  6-gastroesophageal for disease -Continue PPI.  7-COPD/chronic respiratory failure -Continue home oxygen supplementation -Continue the use of home bronchodilators regimen.   Procedures: See below for x-ray reports  Consultations: Nephrology service  Discharge Exam: Vitals:   05/10/21 1545 05/10/21 1600  BP: 134/86 127/74  Pulse:    Resp: 14 16  Temp:    SpO2:      General: Alert, awake and oriented x3; complaining of bilateral knee pains.  No fever. Cardiovascular: Rate controlled, no rubs, no gallops, no JVD on exam. Respiratory: Good air movement bilaterally; no using accessory muscle.  Good saturation on chronic supplementation Abdomen: Soft, nontender, nondistended, positive bowel sounds Extremities: No cyanosis or clubbing.  Discharge Instructions   Discharge Instructions     Diet - low sodium heart healthy   Complete by: As directed    Discharge instructions   Complete by: As directed    Take medications as prescribed Maintain adequate hydration Arrange follow-up with PCP in 10 days Resume outpatient hemodialysis as previously scheduled.   Increase activity slowly   Complete by: As directed       Allergies as of 05/10/2021       Reactions   Amoxicillin    Asa [aspirin]    Gabapentin    Iodinated Contrast Media    Shellfish Allergy         Medication List     STOP taking these medications    baclofen 10 MG tablet Commonly known as: LIORESAL   diclofenac 50 MG EC tablet Commonly known as: VOLTAREN       TAKE these medications    acetaminophen 500 MG tablet Commonly known as: TYLENOL Take by mouth.   amiodarone 200 MG tablet Commonly known as: PACERONE Take 200 mg by mouth daily.   amLODipine 5 MG tablet Commonly known as: NORVASC Take 5 mg by mouth daily.   aspirin 81 MG chewable tablet Chew 81 mg by  mouth daily.   Auryxia 1 GM 210 MG(Fe) tablet Generic drug: ferric citrate Take 210 mg by mouth 3 (three) times daily with meals.   benzonatate 100 MG capsule Commonly known as: TESSALON Take by mouth.   budesonide-formoterol 160-4.5 MCG/ACT inhaler Commonly known as: SYMBICORT Inhale into the lungs.   diclofenac Sodium 1 % Gel Commonly known as: Voltaren Apply to knees bilaterally; initially schedule for 7 days; then every 8hr as needed.   DSS 100 MG Caps Take by mouth.   DULoxetine 30 MG capsule Commonly known as: CYMBALTA Take 30 mg by mouth daily.   Eliquis 2.5 MG Tabs tablet Generic drug: apixaban Take 2.5 mg by mouth 2 (two) times daily.   famotidine 20 MG tablet Commonly known as: PEPCID Take 20 mg by mouth 2 (two) times daily.   ferrous sulfate 325 (65 FE) MG tablet Take 325 mg by mouth every other day. What changed: Another medication with the same name was removed. Continue taking this medication, and follow the directions you see here.   linaclotide 290 MCG Caps capsule Commonly known as: LINZESS Take 1 capsule by mouth daily.   midodrine 10 MG tablet Commonly known as: PROAMATINE Take 10 mg by mouth 3 (three) times daily.   omeprazole 20 MG capsule Commonly known as: PRILOSEC Take 40 mg by mouth daily.   oxyCODONE 5 MG immediate release tablet Commonly known as: Oxy IR/ROXICODONE Take 1 tablet (5 mg total) by mouth every 8 (eight) hours as  needed for severe pain.   pregabalin 50 MG capsule Commonly known as: LYRICA Take 150 mg by mouth daily.   promethazine 12.5 MG tablet Commonly known as: PHENERGAN Take by mouth.   promethazine 6.25 MG/5ML syrup Commonly known as: PHENERGAN Take by mouth every 6 (six) hours as needed for nausea or vomiting.   promethazine-phenylephrine 6.25-5 MG/5ML Syrp Generic drug: promethazine-phenylephrine Take by mouth every 4 (four) hours as needed for congestion.   RENAL VITAMIN PO Take 1 tablet by mouth  daily.   tiotropium 18 MCG inhalation capsule Commonly known as: [place] into inhaler and inhale.               Durable Medical Equipment  (From admission, onward)           Start     Ordered   05/10/21 1522  For home use only DME lightweight manual wheelchair with seat cushion  Once       Comments: Patient suffers from end stage renal disease with weakness which impairs their ability to perform daily activities like bathing, dressing, grooming, and toileting in the home.  A cane, crutch, or walker will not resolve  issue with performing activities of daily living. A wheelchair will allow patient to safely perform daily activities. Patient is not able to propel themselves in the home using a standard weight wheelchair due to general weakness. Patient can self propel in the lightweight wheelchair. Length of need Lifetime. Accessories: elevating leg rests (ELRs), wheel locks, extensions and anti-tippers.   05/10/21 1523           Allergies  Allergen Reactions   Amoxicillin    Asa [Aspirin]    Gabapentin    Iodinated Contrast Media    Shellfish Allergy     Follow-up Information     [facility] Follow up.   Why: wheelchair to be delivered Contact information: [address] [place] [address] [phone]                  The results of significant diagnostics from this hospitalization (including imaging, microbiology, ancillary and laboratory) are listed below for reference.    Significant Diagnostic Studies: US Venous Img Lower Unilateral Left (DVT)  Result Date: 05/09/2021 CLINICAL DATA:  Left lower extremity pain. The patient is currently on anticoagulation. Evaluate for DVT. EXAM: LEFT LOWER EXTREMITY VENOUS DOPPLER ULTRASOUND TECHNIQUE: Gray-scale sonography with graded compression, as well as color Doppler and duplex ultrasound were performed to evaluate the lower extremity deep venous systems from the level of  the common femoral vein and including the common femoral, femoral, profunda femoral, popliteal and calf veins including the posterior tibial, peroneal and gastrocnemius veins when visible. The superficial great saphenous vein was also interrogated. Spectral Doppler was utilized to evaluate flow at rest and with distal augmentation maneuvers in the common femoral, femoral and popliteal veins. COMPARISON:  None. FINDINGS: Contralateral Common Femoral Vein: Respiratory phasicity is normal and symmetric with the symptomatic side. No evidence of thrombus. Normal compressibility. Common Femoral Vein: No evidence of thrombus. Normal compressibility, respiratory phasicity and response to augmentation. Saphenofemoral Junction: No evidence of thrombus. Normal compressibility and flow on color Doppler imaging. Profunda Femoral Vein: No evidence of thrombus. Normal compressibility and flow on color Doppler imaging. Femoral Vein: No evidence of thrombus. Normal compressibility, respiratory phasicity and response to augmentation. Popliteal Vein: No evidence of thrombus. Normal compressibility, respiratory phasicity and response to augmentation. Calf Veins: No evidence of thrombus. Normal compressibility  and flow on color Doppler imaging. Superficial Great Saphenous Vein: No evidence of thrombus. Normal compressibility. Venous Reflux:  None. Other Findings:  None. IMPRESSION: No evidence of DVT within the left lower extremity. Electronically Signed   By: Sandi Mariscal M.D.   On: 05/09/2021 11:04   DG Chest Port 1 View  Result Date: 05/08/2021 CLINICAL DATA:  Altered mental status EXAM: PORTABLE CHEST 1 VIEW COMPARISON:  04/12/2021 FINDINGS: Cardiac shadow is enlarged but stable. 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 The lungs are well aerated bilaterally. Mild central vascular congestion is seen. No bony abnormality is noted. Changes of prior vascular stenting are noted in the left shoulder region. IMPRESSION: Mild vascular  congestion without interstitial edema. Electronically Signed   By: Inez Catalina M.D.   On: 05/08/2021 23:02   EEG adult  Result Date: 05/09/2021 Lora Havens, MD     05/09/2021  3:28 PM Patient Name: Leslie Wilcox MRN: [id number] Epilepsy Attending: Lora Havens Referring Physician/Provider: Rolla Plate, DO Date: 05/09/2021 Duration: 22.19 mins Patient history: 65yo F with ams. EEG to evaluate for seizure Level of alertness: awake AEDs during EEG study: None Technical aspects: This EEG study was done with scalp electrodes positioned according to the 10-20 International system of electrode placement. Electrical activity was acquired at a sampling rate of _0  and reviewed with a high frequency filter of _1  and a low frequency filter of _2 . EEG data were recorded continuously and digitally stored. Description:EEG showed continuous generalized polymorphic sharply contoured 3 to 6 Hz theta-delta slowing. Generalized periodic discharges with triphasic morphology at  _3  were also noted. Hyperventilation and photic stimulation were not performed.   ABNORMALITY - Periodic discharges with triphasic morphology, generalized ( GPDs) - Continuous slow, generalized IMPRESSION: This study showed generalized periodic discharges with triphasic morphology at  _4  which is on the ictal-interictal continuum with low potential for seizures. This eeg pattern can also be seen due to toxic-metabolic causes like hyperammonemia, cefepime toxicity. Addditionally, EEG is suggestive of moderate diffuse encephalopathy, nonspecific etiology. No seizures were seen throughout the recording. Lora Havens   CT HEAD CODE STROKE WO CONTRAST  Addendum Date: 05/08/2021   ADDENDUM REPORT: 05/08/2021 23:38 ADDENDUM: These results were called by telephone at the time of interpretation on 05/08/2021 at 59:56 pm to Dr. Roxanne Mins, who verbally acknowledged these results. Electronically Signed   By: Ulyses Jarred M.D.   On:  05/08/2021 23:38   Result Date: 05/08/2021 CLINICAL DATA:  Code stroke.  Acute neurologic deficits EXAM: CT HEAD WITHOUT CONTRAST TECHNIQUE: Contiguous axial images were obtained from the base of the skull through the vertex without intravenous contrast. COMPARISON:  None. FINDINGS: Brain: There is no mass, hemorrhage or extra-axial collection. The size and configuration of the ventricles and extra-axial CSF spaces are normal. The brain parenchyma is normal, without evidence of acute or chronic infarction. Vascular: No abnormal hyperdensity of the major intracranial arteries or dural venous sinuses. No intracranial atherosclerosis. Skull: The visualized skull base, calvarium and extracranial soft tissues are normal. Sinuses/Orbits: No fluid levels or advanced mucosal thickening of the visualized paranasal sinuses. No mastoid or middle ear effusion. The orbits are normal. ASPECTS [facility] Stroke Program Early CT Score) - Ganglionic level infarction (caudate, lentiform nuclei, internal capsule, insula, M1-M3 cortex): 7 - Supraganglionic infarction (M4-M6 cortex): 3 Total score (0-10 with 10 being normal): 10 IMPRESSION: 1. Normal head CT. 2. ASPECTS is 10. Electronically Signed: By: Ulyses Jarred M.D. On: 05/08/2021 23:33  Microbiology: Recent Results (from the past 240 hour(s))  Resp Panel by RT-PCR (Flu A&B, Covid) Nasopharyngeal Swab     Status: None   Collection Time: 05/08/21 10:00 PM   Specimen: Nasopharyngeal Swab; Nasopharyngeal(NP) swabs in vial transport medium  Result Value Ref Range Status   SARS Coronavirus 2 by RT PCR NEGATIVE NEGATIVE Final    Comment: (NOTE) SARS-CoV-2 target nucleic acids are NOT DETECTED.  The SARS-CoV-2 RNA is generally detectable in upper respiratory specimens during the acute phase of infection. The lowest concentration of SARS-CoV-2 viral copies this assay can detect is 138 copies/mL. A negative result does not preclude SARS-Cov-2 infection and should not be  used as the sole basis for treatment or other patient management decisions. A negative result may occur with  improper specimen collection/handling, submission of specimen other than nasopharyngeal swab, presence of viral mutation(s) within the areas targeted by this assay, and inadequate number of viral copies(<138 copies/mL). A negative result must be combined with clinical observations, patient history, and epidemiological information. The expected result is Negative.  Fact Sheet for Patients:  EntrepreneurPulse.com.au  Fact Sheet for Healthcare Providers:  IncredibleEmployment.be  This test is no t yet approved or cleared by the [place] FDA and  has been authorized for detection and/or diagnosis of SARS-CoV-2 by FDA under an Emergency Use Authorization (EUA). This EUA will remain  in effect (meaning this test can be used) for the duration of the COVID-19 declaration under Section 564(b)(1) of the Act, 21 U.S.C.section 360bbb-3(b)(1), unless the authorization is terminated  or revoked sooner.       Influenza A by PCR NEGATIVE NEGATIVE Final   Influenza B by PCR NEGATIVE NEGATIVE Final    Comment: (NOTE) The Xpert Xpress SARS-CoV-2/FLU/RSV plus assay is intended as an aid in the diagnosis of influenza from Nasopharyngeal swab specimens and should not be used as a sole basis for treatment. Nasal washings and aspirates are unacceptable for Xpert Xpress SARS-CoV-2/FLU/RSV testing.  Fact Sheet for Patients: EntrepreneurPulse.com.au  Fact Sheet for Healthcare Providers: IncredibleEmployment.be  This test is not yet approved or cleared by the [place] FDA and has been authorized for detection and/or diagnosis of SARS-CoV-2 by FDA under an Emergency Use Authorization (EUA). This EUA will remain in effect (meaning this test can be used) for the duration of the COVID-19 declaration under Section  564(b)(1) of the Act, 21 U.S.C. section 360bbb-3(b)(1), unless the authorization is terminated or revoked.  Performed at [facility], [address] [place], [place] [address]   Blood Cultures (routine x 2)     Status: None (Preliminary result)   Collection Time: 05/08/21 10:56 PM   Specimen: BLOOD RIGHT HAND  Result Value Ref Range Status   Specimen Description BLOOD RIGHT HAND  Final   Special Requests   Final    BOTTLES DRAWN AEROBIC AND ANAEROBIC Blood Culture adequate volume   Culture   Final    NO GROWTH < 12 HOURS Performed at [facility], [address], [place], [place] [address]    Report Status PENDING  Incomplete  Blood Cultures (routine x 2)     Status: None (Preliminary result)   Collection Time: 05/08/21 10:56 PM   Specimen: Right Antecubital; Blood  Result Value Ref Range Status   Specimen Description   Final    RIGHT ANTECUBITAL BOTTLES DRAWN AEROBIC AND ANAEROBIC   Special Requests Blood Culture adequate volume  Final   Culture   Final    NO GROWTH < 12 HOURS  Performed at [facility], [address], [place], [place] [address]    Report Status PENDING  Incomplete  MRSA Next Gen by PCR, Nasal     Status: None   Collection Time: 05/09/21  1:47 AM   Specimen: Nasal Mucosa; Nasal Swab  Result Value Ref Range Status   MRSA by PCR Next Gen NOT DETECTED NOT DETECTED Final    Comment: (NOTE) The GeneXpert MRSA Assay (FDA approved for NASAL specimens only), is one component of a comprehensive MRSA colonization surveillance program. It is not intended to diagnose MRSA infection nor to guide or monitor treatment for MRSA infections. Test performance is not FDA approved in patients less than 40 years old. Performed at [facility], [address], [place], [place] [address]      Labs: Basic Metabolic Panel: Recent Labs  Lab 05/08/21 2200 05/09/21 0416  NA 143 144  K 4.4 4.6  CL 100 102  CO2 21* 21*  GLUCOSE 118* 97  BUN 44* 46*  CREATININE 10.64* 11.37*   CALCIUM 8.5* 8.1*  MG  --  2.2   Liver Function Tests: Recent Labs  Lab 05/08/21 2200 05/09/21 0416  AST 22 17  ALT 19 16  ALKPHOS 158* 136*  BILITOT 0.8 0.5  PROT 7.1 6.2*  ALBUMIN 4.1 3.6   No results for input(s): LIPASE, AMYLASE in the last 168 hours. Recent Labs  Lab 05/08/21 2255  AMMONIA 24   CBC: Recent Labs  Lab 05/08/21 2200 05/09/21 0416  WBC 13.6* 9.9  NEUTROABS 9.1* 7.2  HGB 12.6 11.6*  HCT 40.5 39.4  MCV 96.7 100.3*  PLT 186 113*   Cardiac Enzymes: Recent Labs  Lab 05/08/21 2200  CKTOTAL 186   BNP: BNP (last 3 results) No results for input(s): BNP in the last 8760 hours.  ProBNP (last 3 results) No results for input(s): PROBNP in the last 8760 hours.  CBG: Recent Labs  Lab 05/08/21 2207  GLUCAP 110*       Signed:  Barton Dubois MD.  [facility] 05/10/2021, 4:12 PM

## 2021-05-10 NOTE — TOC Transition Note (Addendum)
Transition of Care [facility]) - CM/SW Discharge Note   Patient Details  Name: Leslie Wilcox MRN: [id number] Date of Birth: 1955/06/22  [facility] CM/SW Contact:  Boneta Lucks, RN Phone Number: 05/10/2021, 3:25 PM   Clinical Narrative:   Patient discharging home after dialysis. Per granddaughter she needs a wheelchair. Caryl Pina with [facility] accepted the referral.   No home health agencies to take medicaid. [place] -Daughter updated. Linda with [facility] with check to see if patient can go into [facility] trial. [place] [address].  Final next level of care: Home/Self Care Barriers to Discharge: Barriers Resolved   Patient Goals and CMS Choice Patient states their goals for this hospitalization and ongoing recovery are:: return home   Discharge Placement          Patient to be transferred to facility by: Grand-daughter Name of family member notified: Donivan Scull Patient and family notified of of transfer: 05/10/21  Discharge Plan and Services In-house Referral: Clinical Social Work   Readmission Risk Interventions No flowsheet data found.

## 2021-05-10 NOTE — Progress Notes (Signed)
Nsg Discharge Note  Admit Date:  05/08/2021 Discharge date: 05/10/2021   Brittinee Risk to be D/C'd Home per MD order.  AVS completed.  Copy for chart, and copy for patient signed, and dated. Patient/caregiver able to verbalize understanding.  Discharge Medication: Allergies as of 05/10/2021       Reactions   Amoxicillin    Asa [aspirin]    Gabapentin    Iodinated Contrast Media    Shellfish Allergy         Medication List     STOP taking these medications    baclofen 10 MG tablet Commonly known as: LIORESAL   diclofenac 50 MG EC tablet Commonly known as: VOLTAREN       TAKE these medications    acetaminophen 500 MG tablet Commonly known as: TYLENOL Take by mouth.   amiodarone 200 MG tablet Commonly known as: PACERONE Take 200 mg by mouth daily.   amLODipine 5 MG tablet Commonly known as: NORVASC Take 5 mg by mouth daily.   aspirin 81 MG chewable tablet Chew 81 mg by mouth daily.   Auryxia 1 GM 210 MG(Fe) tablet Generic drug: ferric citrate Take 210 mg by mouth 3 (three) times daily with meals.   benzonatate 100 MG capsule Commonly known as: TESSALON Take by mouth.   budesonide-formoterol 160-4.5 MCG/ACT inhaler Commonly known as: SYMBICORT Inhale into the lungs.   diclofenac Sodium 1 % Gel Commonly known as: Voltaren Apply to knees bilaterally; initially schedule for 7 days; then every 8hr as needed.   DSS 100 MG Caps Take by mouth.   DULoxetine 30 MG capsule Commonly known as: CYMBALTA Take 30 mg by mouth daily.   Eliquis 2.5 MG Tabs tablet Generic drug: apixaban Take 2.5 mg by mouth 2 (two) times daily.   famotidine 20 MG tablet Commonly known as: PEPCID Take 20 mg by mouth 2 (two) times daily.   ferrous sulfate 325 (65 FE) MG tablet Take 325 mg by mouth every other day. What changed: Another medication with the same name was removed. Continue taking this medication, and follow the directions you see here.   linaclotide 290 MCG  Caps capsule Commonly known as: LINZESS Take 1 capsule by mouth daily.   midodrine 10 MG tablet Commonly known as: PROAMATINE Take 10 mg by mouth 3 (three) times daily.   omeprazole 20 MG capsule Commonly known as: PRILOSEC Take 40 mg by mouth daily.   oxyCODONE 5 MG immediate release tablet Commonly known as: Oxy IR/ROXICODONE Take 1 tablet (5 mg total) by mouth every 8 (eight) hours as needed for severe pain.   pregabalin 50 MG capsule Commonly known as: LYRICA Take 150 mg by mouth daily.   promethazine 12.5 MG tablet Commonly known as: PHENERGAN Take by mouth.   promethazine 6.25 MG/5ML syrup Commonly known as: PHENERGAN Take by mouth every 6 (six) hours as needed for nausea or vomiting.   promethazine-phenylephrine 6.25-5 MG/5ML Syrp Generic drug: promethazine-phenylephrine Take by mouth every 4 (four) hours as needed for congestion.   RENAL VITAMIN PO Take 1 tablet by mouth daily.   tiotropium 18 MCG inhalation capsule Commonly known as: [place] into inhaler and inhale.               Durable Medical Equipment  (From admission, onward)           Start     Ordered   05/10/21 1522  For home use only DME lightweight manual wheelchair with seat cushion  Once  Comments: Patient suffers from end stage renal disease with weakness which impairs their ability to perform daily activities like bathing, dressing, grooming, and toileting in the home.  A cane, crutch, or walker will not resolve  issue with performing activities of daily living. A wheelchair will allow patient to safely perform daily activities. Patient is not able to propel themselves in the home using a standard weight wheelchair due to general weakness. Patient can self propel in the lightweight wheelchair. Length of need Lifetime. Accessories: elevating leg rests (ELRs), wheel locks, extensions and anti-tippers.   05/10/21 1523            Discharge Assessment: Vitals:    05/10/21 1750 05/10/21 1907  BP: 140/72   Pulse: 95   Resp: 14   Temp: 98.4 F (36.9 C)   SpO2: 98% 97%   Skin clean, dry and intact without evidence of skin break down, no evidence of skin tears noted. IV catheter discontinued intact. Site without signs and symptoms of complications - no redness or edema noted at insertion site, patient denies c/o pain - only slight tenderness at site.  Dressing with slight pressure applied.  D/c Instructions-Education: Discharge instructions given to patient/family with verbalized understanding. D/c education completed with patient/family including follow up instructions, medication list, d/c activities limitations if indicated, with other d/c instructions as indicated by MD - patient able to verbalize understanding, all questions fully answered. Patient instructed to return to ED, call 911, or call MD for any changes in condition.  Patient escorted via [place], and D/C home via private auto.  Dorcas Mcmurray, LPN 34/19/3790 2:40 PM

## 2021-05-10 NOTE — Progress Notes (Signed)
Granddaughter came to pick up patient. The granddaughter brought an oxygen tank that was empty. The granddaughter and the patient both stated that the patient would be fine to take the 20 minute drive home without her on oxygen. I spoke to the charge nurse and she talked to the [facility] and due to Covid we are unable to let the patient go home with a tank at this time. The patient wanted to leave. The granddaughter didn't want to drive home to get a tank. They both kept saying that the patient would be fine to leave. I checked the patient O2 on RA and it was 96%. Patient refusing to stay or go home to get another tank. Educated patient on safe discharge and went over discharge paperwork with granddaughter and patient.  Patient oxygen saturation maintaining in the 90's. Patient left on room air, refusing to stay or wait for O2 for safe discharge.

## 2021-05-11 LAB — HEPATITIS B SURFACE ANTIBODY, QUANTITATIVE: Hep B S AB Quant (Post): 7.2 m[IU]/mL — ABNORMAL LOW (ref >9.9)

## 2021-05-13 LAB — CULTURE, BLOOD (ROUTINE X 2)
Culture: NO GROWTH
Culture: NO GROWTH
Special Requests: ADEQUATE
Special Requests: ADEQUATE

## 2021-07-14 ENCOUNTER — Emergency Department (HOSPITAL_COMMUNITY)
Admission: EM | Admit: 2021-07-14 | Discharge: 2021-07-14 | Disposition: A | Discharge status: Home / Self Care | Payer: Medicaid Other | Attending: Emergency Medicine | Admitting: Emergency Medicine

## 2021-07-14 ENCOUNTER — Encounter (HOSPITAL_COMMUNITY): Payer: Self-pay

## 2021-07-14 ENCOUNTER — Other Ambulatory Visit: Payer: Self-pay

## 2021-07-14 DIAGNOSIS — Z79899 Other long term (current) drug therapy: Secondary | ICD-10-CM | POA: Insufficient documentation

## 2021-07-14 DIAGNOSIS — N186 End stage renal disease: Secondary | ICD-10-CM | POA: Diagnosis not present

## 2021-07-14 DIAGNOSIS — Z992 Dependence on renal dialysis: Secondary | ICD-10-CM | POA: Insufficient documentation

## 2021-07-14 DIAGNOSIS — Z7982 Long term (current) use of aspirin: Secondary | ICD-10-CM | POA: Insufficient documentation

## 2021-07-14 DIAGNOSIS — R748 Abnormal levels of other serum enzymes: Secondary | ICD-10-CM | POA: Insufficient documentation

## 2021-07-14 DIAGNOSIS — I509 Heart failure, unspecified: Secondary | ICD-10-CM | POA: Diagnosis not present

## 2021-07-14 DIAGNOSIS — I132 Hypertensive heart and chronic kidney disease with heart failure and with stage 5 chronic kidney disease, or end stage renal disease: Secondary | ICD-10-CM | POA: Insufficient documentation

## 2021-07-14 DIAGNOSIS — R944 Abnormal results of kidney function studies: Secondary | ICD-10-CM | POA: Diagnosis not present

## 2021-07-14 DIAGNOSIS — Z7901 Long term (current) use of anticoagulants: Secondary | ICD-10-CM | POA: Insufficient documentation

## 2021-07-14 DIAGNOSIS — R4182 Altered mental status, unspecified: Secondary | ICD-10-CM | POA: Diagnosis not present

## 2021-07-14 DIAGNOSIS — J449 Chronic obstructive pulmonary disease, unspecified: Secondary | ICD-10-CM | POA: Insufficient documentation

## 2021-07-14 DIAGNOSIS — G894 Chronic pain syndrome: Secondary | ICD-10-CM | POA: Insufficient documentation

## 2021-07-14 LAB — CBC WITH DIFFERENTIAL/PLATELET
Abs Immature Granulocytes: 0.06 10*3/uL (ref 0.00–0.07)
Basophils Absolute: 0 10*3/uL (ref 0.0–0.1)
Basophils Relative: 0 %
Eosinophils Absolute: 0.3 10*3/uL (ref 0.0–0.5)
Eosinophils Relative: 3 %
HCT: 32.3 % — ABNORMAL LOW (ref 36.0–46.0)
Hemoglobin: 9.9 g/dL — ABNORMAL LOW (ref 12.0–15.0)
Immature Granulocytes: 1 %
Lymphocytes Relative: 17 %
Lymphs Abs: 1.7 10*3/uL (ref 0.7–4.0)
MCH: 29.6 pg (ref 26.0–34.0)
MCHC: 30.7 g/dL (ref 30.0–36.0)
MCV: 96.4 fL (ref 80.0–100.0)
Monocytes Absolute: 1 10*3/uL (ref 0.1–1.0)
Monocytes Relative: 9 %
Neutro Abs: 7.3 10*3/uL (ref 1.7–7.7)
Neutrophils Relative %: 70 %
Platelets: 183 10*3/uL (ref 150–400)
RBC: 3.35 MIL/uL — ABNORMAL LOW (ref 3.87–5.11)
RDW: 15.3 % (ref 11.5–15.5)
WBC: 10.3 10*3/uL (ref 4.0–10.5)
nRBC: 0 % (ref 0.0–0.2)

## 2021-07-14 LAB — COMPREHENSIVE METABOLIC PANEL
ALT: 24 U/L (ref 0–44)
AST: 27 U/L (ref 15–41)
Albumin: 3.6 g/dL (ref 3.5–5.0)
Alkaline Phosphatase: 118 U/L (ref 38–126)
Anion gap: 17 — ABNORMAL HIGH (ref 5–15)
BUN: 52 mg/dL — ABNORMAL HIGH (ref 8–23)
CO2: 26 mmol/L (ref 22–32)
Calcium: 8.6 mg/dL — ABNORMAL LOW (ref 8.9–10.3)
Chloride: 99 mmol/L (ref 98–111)
Creatinine, Ser: 10.19 mg/dL — ABNORMAL HIGH (ref 0.44–1.00)
GFR, Estimated: 4 mL/min — ABNORMAL LOW (ref >60)
Glucose, Bld: 106 mg/dL — ABNORMAL HIGH (ref 70–99)
Potassium: 5 mmol/L (ref 3.5–5.1)
Sodium: 142 mmol/L (ref 135–145)
Total Bilirubin: 0.4 mg/dL (ref 0.3–1.2)
Total Protein: 7 g/dL (ref 6.5–8.1)

## 2021-07-14 LAB — MAGNESIUM: Magnesium: 2.2 mg/dL (ref 1.7–2.4)

## 2021-07-14 LAB — PHOSPHORUS: Phosphorus: 5.3 mg/dL — ABNORMAL HIGH (ref 2.5–4.6)

## 2021-07-14 MED ORDER — HYDROMORPHONE HCL 1 MG/ML IJ SOLN
1.0000 mg | Freq: Once | INTRAMUSCULAR | Status: AC
  Administered 2021-07-14: 1 mg via INTRAVENOUS
  Filled 2021-07-14: qty 1

## 2021-07-14 NOTE — ED Triage Notes (Signed)
Pt from home c/o back pain since waking this AM.  3L [place] chronic. ?

## 2021-07-14 NOTE — ED Notes (Signed)
Contacted [facility] in [place] to reschedule for tomorrow at 11:45 am. ?

## 2021-07-14 NOTE — ED Provider Notes (Signed)
[facility] Provider Note   CSN: [id number] Arrival date & time: 07/14/21  0630     History  Chief Complaint  Patient presents with   Back Pain    Leslie Wilcox is a 66 y.o. female.   Back Pain  This patient is a 66 year old female with a history of hypertension on amlodipine, she has a history of atrial fibrillation on amiodarone and Eliquis and she has a history of acid reflux as well as chronic pain.  The patient reports to me that she has been on dialysis for over 15 years and has had a left upper extremity fistula which has been used successfully for that entire time.  She goes to dialysis Tuesday Thursday and Saturday and was supposed to go this morning but decided to come to the emergency department because of increasing pain all over her body.  The patient reports that she is very immobile at home secondary to her chronic pain of unknown etiology.  She has been treated by her family doctor in [place] for many years due to the pain, they have diagnosed her with what is thought to be fibromyalgia and have prescribed her pregabalin however she refuses to take the medication stating that it does not help that much.  She has been prescribed other medicines in the past such as oxycodone as recently as December 2022.  She reports the pain is "all over, and involves both of her legs from the knees to the hips, her entire back and it hurts to use her legs or her back.  When I asked how long this has been going on she just states "a long time".  She denies any acute injuries, she denies fevers or difficulty breathing, she has not had any nausea vomiting or diarrhea.  She was not given any medications by paramedics prior to arrival.  The patient's primary care doctors are in [place].  She states that she does not go to a pain clinic     Home Medications Prior to Admission medications   Medication Sig Start Date End Date Taking? Authorizing Provider   acetaminophen (TYLENOL) 500 MG tablet Take by mouth.    [provider]  amiodarone (PACERONE) 200 MG tablet Take 200 mg by mouth daily.    [provider]  amLODipine (NORVASC) 5 MG tablet Take 5 mg by mouth daily.    [provider]  apixaban (ELIQUIS) 2.5 MG TABS tablet Take 2.5 mg by mouth 2 (two) times daily.    [provider]  aspirin 81 MG chewable tablet Chew 81 mg by mouth daily.    [provider]  B Complex-C-Folic Acid (RENAL VITAMIN PO) Take 1 tablet by mouth daily.    [provider]  benzonatate (TESSALON) 100 MG capsule Take by mouth.    [provider]  budesonide-formoterol (SYMBICORT) 160-4.5 MCG/ACT inhaler Inhale into the lungs.    [provider]  diclofenac Sodium (VOLTAREN) 1 % GEL Apply to knees bilaterally; initially schedule for 7 days; then every 8hr as needed. 05/10/21   Barton Dubois, MD  Docusate Sodium (DSS) 100 MG CAPS Take by mouth.    [provider]  DULoxetine (CYMBALTA) 30 MG capsule Take 30 mg by mouth daily.    [provider]  famotidine (PEPCID) 20 MG tablet Take 20 mg by mouth 2 (two) times daily.    [provider]  ferric citrate (AURYXIA) 1 GM 210 MG(Fe) tablet Take 210 mg by  mouth 3 (three) times daily with meals.    [provider]  ferrous sulfate 325 (65 FE) MG tablet Take 325 mg by mouth every other day.    [provider]  linaclotide (LINZESS) 290 MCG CAPS capsule Take 1 capsule by mouth daily.    [provider]  midodrine (PROAMATINE) 10 MG tablet Take 10 mg by mouth 3 (three) times daily.    [provider]  omeprazole (PRILOSEC) 20 MG capsule Take 40 mg by mouth daily.    [provider]  oxyCODONE (OXY IR/ROXICODONE) 5 MG immediate release tablet Take 1 tablet (5 mg total) by mouth every 8 (eight) hours as needed for severe pain. 05/10/21   Barton Dubois, MD  pregabalin (LYRICA) 50 MG capsule  Take 150 mg by mouth daily.    [provider]  promethazine (PHENERGAN) 12.5 MG tablet Take by mouth.    [provider]  promethazine (PHENERGAN) 6.25 MG/5ML syrup Take by mouth every 6 (six) hours as needed for nausea or vomiting.    [provider]  promethazine-phenylephrine 6.25-5 MG/5ML SYRP Take by mouth every 4 (four) hours as needed for congestion.    [provider]  tiotropium (SPIRIVA) 18 MCG inhalation capsule Place into inhaler and inhale.    [provider]      Allergies    Amoxicillin, Asa [aspirin], Gabapentin, Iodinated contrast media, and Shellfish allergy    Review of Systems   Review of Systems  Musculoskeletal:  Positive for back pain.  All other systems reviewed and are negative.  Physical Exam Updated Vital Signs BP 135/64    Pulse 86    Resp 18    Ht 1.626 m (5\' 4" )    Wt 90.7 kg    SpO2 100%    BMI 34.33 kg/m  Physical Exam Vitals and nursing note reviewed.  Constitutional:      Appearance: She is well-developed. She is not diaphoretic.  HENT:     Head: Normocephalic and atraumatic.     Nose: Nose normal.     Mouth/Throat:     Mouth: Mucous membranes are moist.  Eyes:     General:        Right eye: No discharge.        Left eye: No discharge.     Conjunctiva/sclera: Conjunctivae normal.  Cardiovascular:     Rate and Rhythm: Normal rate.     Heart sounds: Murmur heard.     Comments: Left upper extremity fistula with good thrill, no overlying redness or tenderness Pulmonary:     Effort: Pulmonary effort is normal. No respiratory distress.  Musculoskeletal:        General: Tenderness present.     Right lower leg: No edema.     Left lower leg: No edema.     Comments: The patient is diffusely tender to palpation legs from the knees to the hips, the compartments are very soft and there is no overlying redness or crepitance.  She has pain with range of motion of her knees and her hips.  She has diffuse  tenderness across her entire back from the trapezius muscles all the way down through the lumbosacral area paraspinal as well as midline.  There is nothing focal, there is no redness, there is no signs of trauma  Skin:    General: Skin is warm and dry.     Findings: No erythema or rash.  Neurological:     Mental Status: She is alert.  Coordination: Coordination normal.     Comments: In the supine position the patient is able to move both legs and both arms symmetrically, there is no asymmetrical weakness.  She has normal grips.    ED Results / Procedures / Treatments   Labs (all labs ordered are listed, but only abnormal results are displayed) Labs Reviewed  COMPREHENSIVE METABOLIC PANEL - Abnormal; Notable for the following components:      Result Value   Glucose, Bld 106 (*)    BUN 52 (*)    Creatinine, Ser 10.19 (*)    Calcium 8.6 (*)    GFR, Estimated 4 (*)    Anion gap 17 (*)    All other components within normal limits  CBC WITH DIFFERENTIAL/PLATELET - Abnormal; Notable for the following components:   RBC 3.35 (*)    Hemoglobin 9.9 (*)    HCT 32.3 (*)    All other components within normal limits  PHOSPHORUS - Abnormal; Notable for the following components:   Phosphorus 5.3 (*)    All other components within normal limits  MAGNESIUM    EKG None  Radiology No results found.  Procedures Procedures    Medications Ordered in ED Medications  HYDROmorphone (DILAUDID) injection 1 mg (1 mg Intravenous Given 07/14/21 3419)    ED Course/ Medical Decision Making/ A&P                           Medical Decision Making Amount and/or Complexity of Data Reviewed Independent Historian: parent and EMS    Details: Chronic pain, seems to be worse this morning. External Data Reviewed: notes.    Details: The patient's drug database was reviewed showing that she has had tramadol filled twice in the month of February for small amounts, pregabalin was also filled in February.   She was last given oxycodone in December 2022  In December 2022 the patient had been admitted to the hospital for altered mental status.  She had been prescribed baclofen for her leg pain, every time she took that medication it caused her to be altered to some degree, this resolved spontaneously while in the hospital Labs: ordered.  Risk Prescription drug management.   This patient presents to the ED for concern of diffuse pain, this involves an extensive number of treatment options, and is a complaint that carries with it a high risk of complications and morbidity.  The differential diagnosis includes chronic pain, fibromyalgia, calciphylaxis (seems less likely given the absence of any skin findings), infection, also seems less likely given no fever tachycardia or changes in the skin.  This is chronic pain, she has been seen multiple times in the past for this   Co morbidities that complicate the patient evaluation  COPD, CHF, ESRD, Chronic pain   Lab Tests:  I Ordered, and personally interpreted labs.  The pertinent results include: Chronic kidney disease with known creatinine which is elevated, thankfully the potassium is 5.0.  CBC without significant anemia, phosphorus slightly elevated at 5.3, magnesium normal.   Imaging Studies ordered:  Not indicated - no focal abnormality to suggest need for imaging - she is not altered and has no respiratory symptoms - her leg pain and back pain are chronic and there are no neuro sx.   Cardiac Monitoring:  The patient was maintained on a cardiac monitor.  I personally viewed and interpreted the cardiac monitored which showed an underlying rhythm of: normal sinus rhythm   Medicines  ordered and prescription drug management:  I ordered medication including hydromorphone  for chronic pain  Reevaluation of the patient after these medicines showed that the patient improved I have reviewed the patients home medicines and have made adjustments as  needed    Critical Interventions:  Pain control, the patient did better, she is agreeable to go home and follow-up with outpatient pain management, we have arranged dialysis    Social Determinants of Health:  Chronically debilitated with chronic pain, end-stage renal disease, the patient was offered nursing home placement but she refuses stating that she wants to stay at home with her husband and her granddaughter who is being paid to watch over she and her husband We have arranged dialysis for this patient so that she can follow-up tomorrow since she was not able to go today, 1145, the patient was informed of this           Final Clinical Impression(s) / ED Diagnoses Final diagnoses:  Chronic pain syndrome  [facility]    Rx / DC Orders ED Discharge Orders     None         Noemi Chapel, MD 07/14/21 [phone]

## 2021-07-14 NOTE — Discharge Instructions (Addendum)
We have contacted your dialysis center, they state that they will be able to dialyze you tomorrow at 1145, please show up about 30 minutes early for your appointment. ? ?Your testing today has been reassuring, it is not clear why you are having chronic pain however I would recommend that you follow-up with your family doctor or with a pain clinic.  Please see the phone number attached for pain clinics. ? ?Emergency department for severe or worsening symptoms. ?

## 2023-02-17 IMAGING — CT CT HEAD CODE STROKE
3 of 4 series · 14 of 47 positions shown, 16 images · non-contrast
Comparison: None.
COMPARISON: None.

Addendum:
CLINICAL DATA: Code stroke.  Acute neurologic deficits

EXAM:
CT HEAD WITHOUT CONTRAST
TECHNIQUE: Contiguous axial images were obtained from the base of the skull
through the vertex without intravenous contrast.

[Series 3: head w o · axial · 0.45mm/px · z∈[-100,+55]mm · 8 of 37 slices shown, 10 images]
[im 3/37  brain]
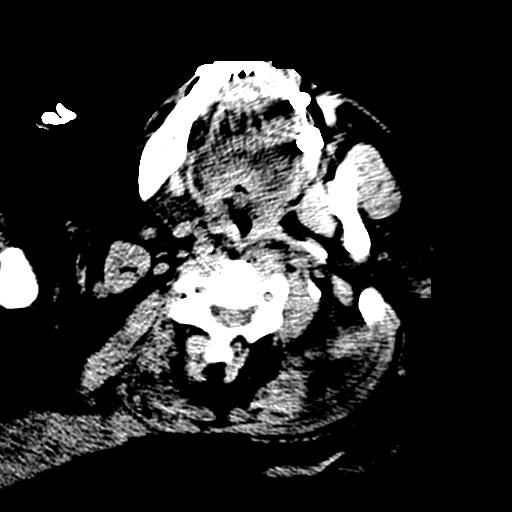
[im 3/37  bone]
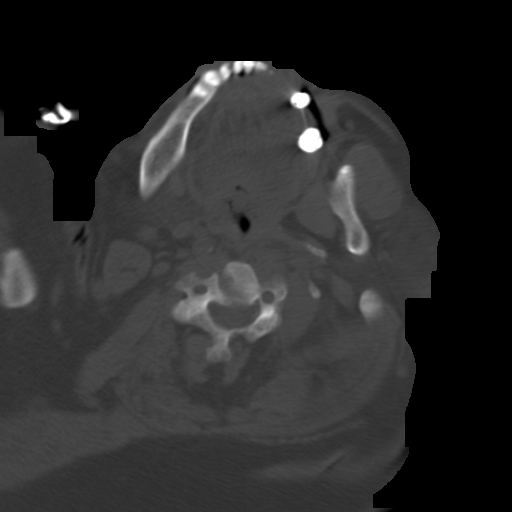
[im 8/37  brain]
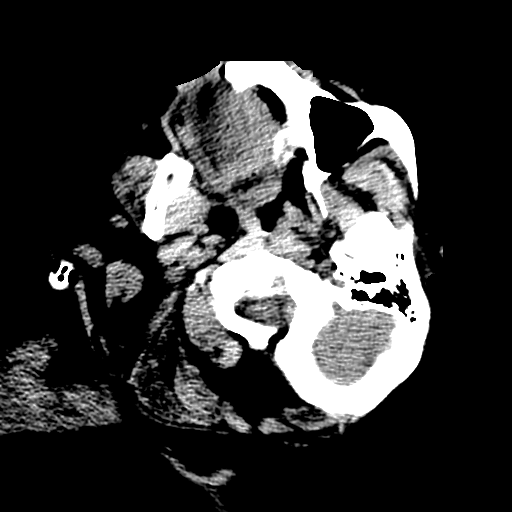
[im 13/37  brain]
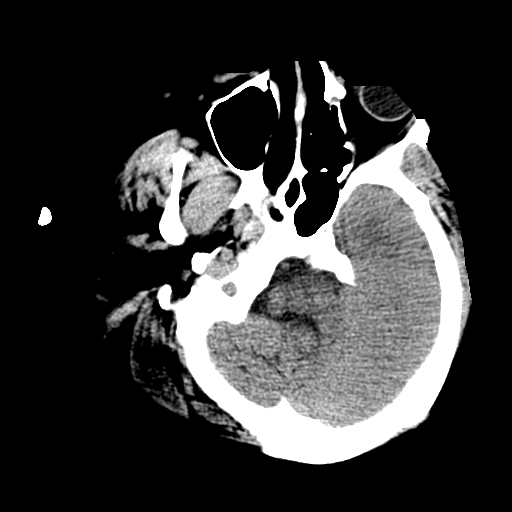
[im 16/37  brain]
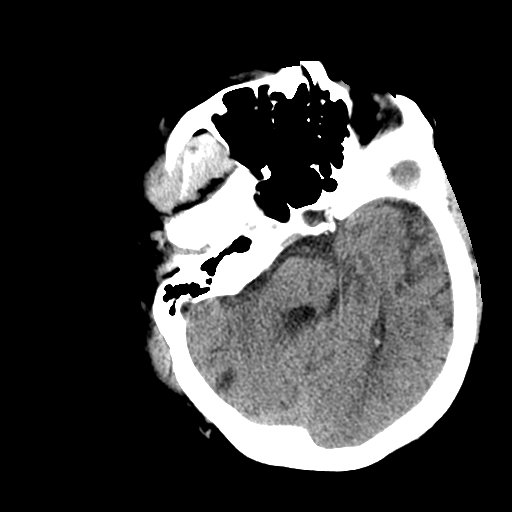
[im 21/37  brain]
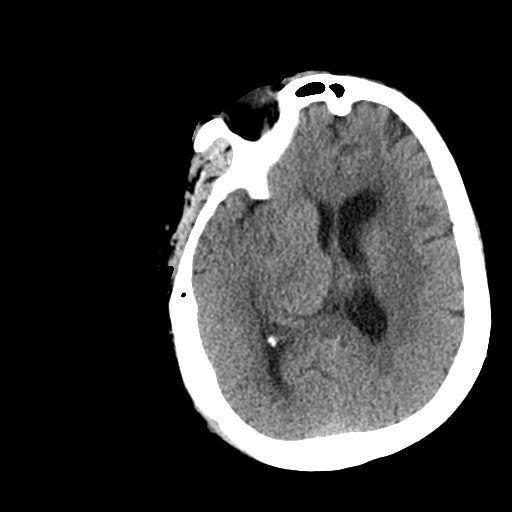
[im 21/37  bone]
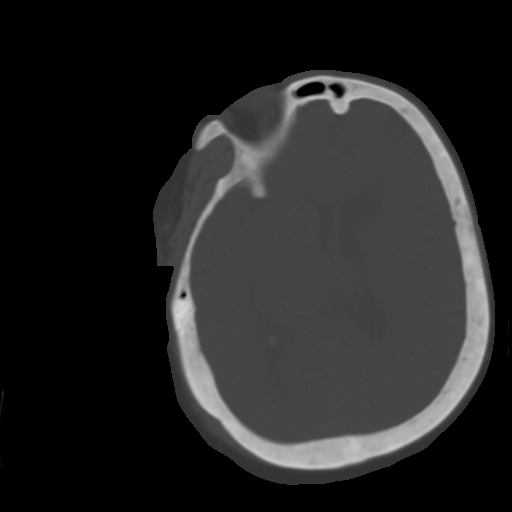
[im 24/37  brain]
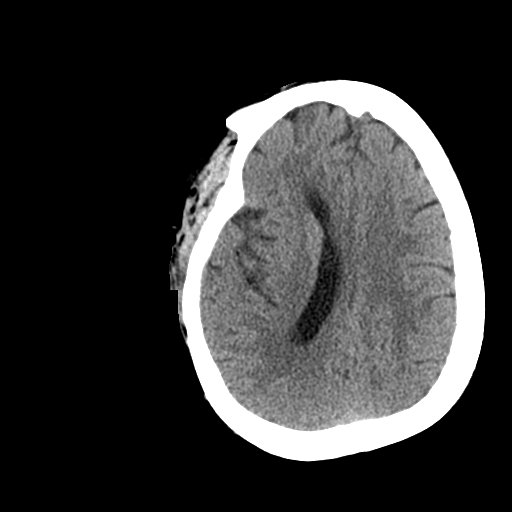
[im 29/37  brain]
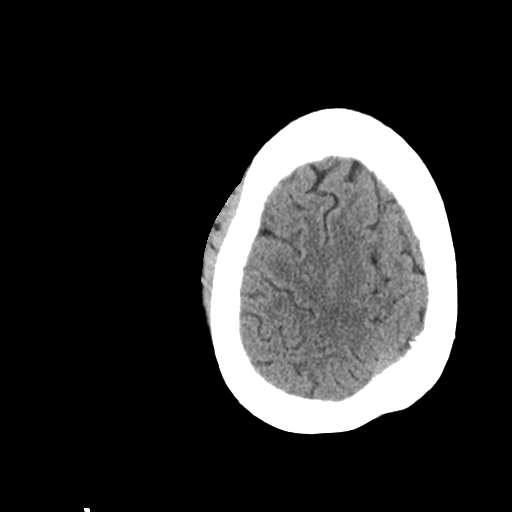
[im 34/37  brain]
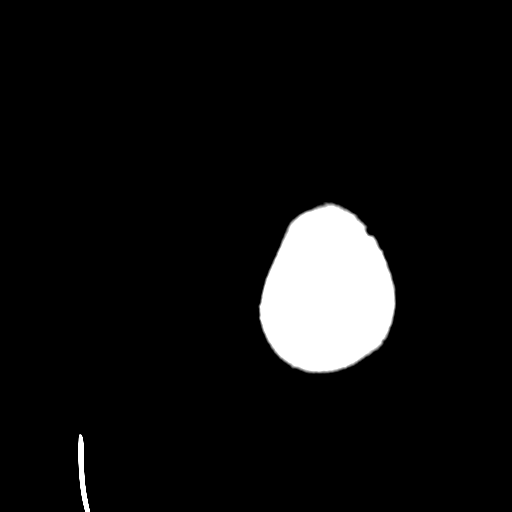

[Series 5: coronal soft · coronal · 0.32mm/px · 3 of 67 slices shown]
[im 23/67  brain]
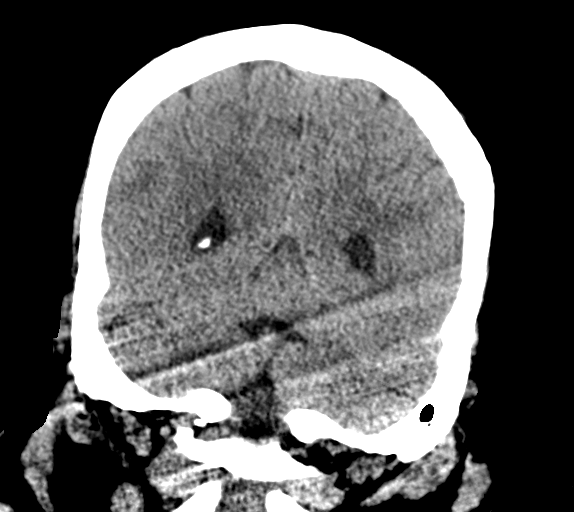
[im 30/67  brain]
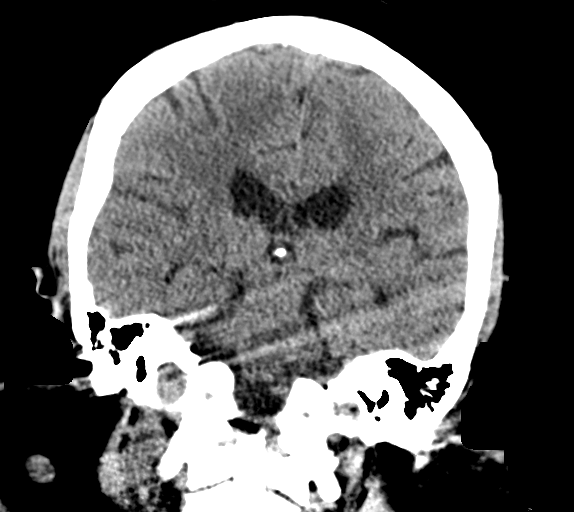
[im 37/67  brain]
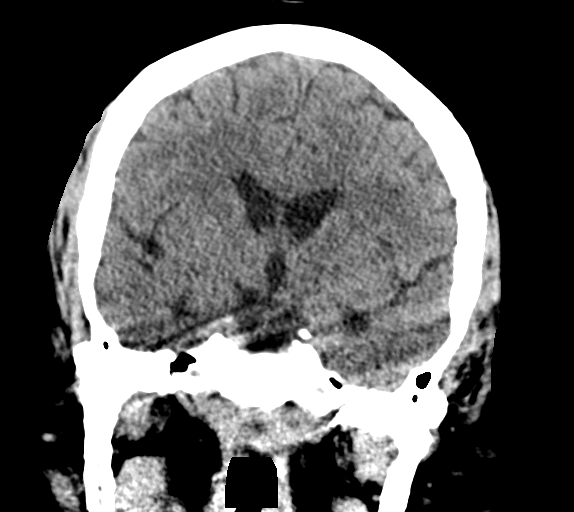

[Series 6: sagittal soft · sagittal · 0.37mm/px · 3 of 67 slices shown]
[im 31/67  brain]
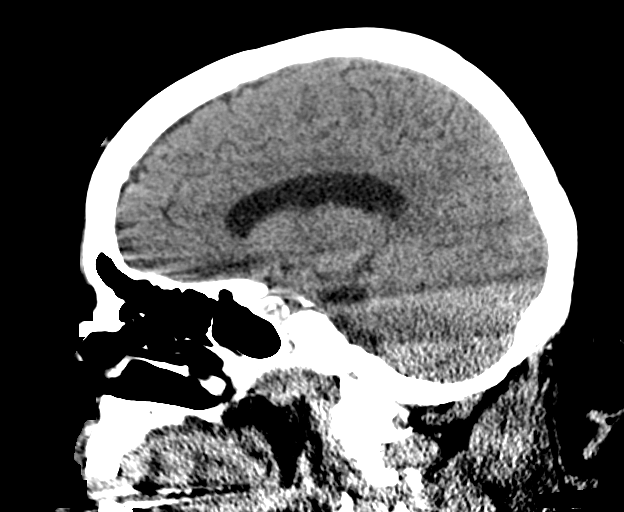
[im 34/67  brain]
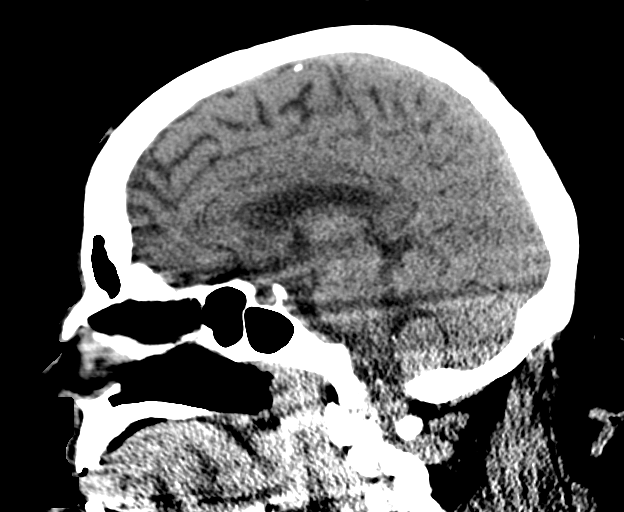
[im 37/67  brain]
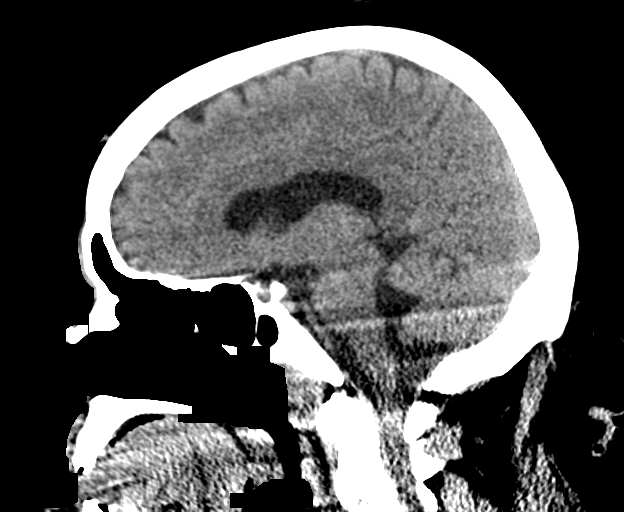

[14 of 47 positions shown; findings below may reference images not displayed]

FINDINGS: Brain: There is no mass, hemorrhage or extra-axial collection. The
size and configuration of the ventricles and extra-axial CSF spaces
are normal. The brain parenchyma is normal, without evidence of
acute or chronic infarction.

Vascular: No abnormal hyperdensity of the major intracranial
arteries or dural venous sinuses. No intracranial atherosclerosis.

Skull: The visualized skull base, calvarium and extracranial soft
tissues are normal.

Sinuses/Orbits: No fluid levels or advanced mucosal thickening of
the visualized paranasal sinuses. No mastoid or middle ear effusion.
The orbits are normal.

ASPECTS (Alberta Stroke Program Early CT Score)

- Ganglionic level infarction (caudate, lentiform nuclei, internal
capsule, insula, M1-M3 cortex): 7

- Supraganglionic infarction (M4-M6 cortex): 3

Total score (0-10 with 10 being normal): 10
IMPRESSION: 1. Normal head CT.
2. ASPECTS is 10.

ADDENDUM:
These results were called by telephone at the time of interpretation
on 05/08/2021 at [DATE] to Dr. Jansen, who verbally acknowledged
these results.

*** End of Addendum ***
FINDINGS: Brain: There is no mass, hemorrhage or extra-axial collection. The
size and configuration of the ventricles and extra-axial CSF spaces
are normal. The brain parenchyma is normal, without evidence of
acute or chronic infarction.

Vascular: No abnormal hyperdensity of the major intracranial
arteries or dural venous sinuses. No intracranial atherosclerosis.

Skull: The visualized skull base, calvarium and extracranial soft
tissues are normal.

Sinuses/Orbits: No fluid levels or advanced mucosal thickening of
the visualized paranasal sinuses. No mastoid or middle ear effusion.
The orbits are normal.

ASPECTS (Alberta Stroke Program Early CT Score)

- Ganglionic level infarction (caudate, lentiform nuclei, internal
capsule, insula, M1-M3 cortex): 7

- Supraganglionic infarction (M4-M6 cortex): 3

Total score (0-10 with 10 being normal): 10
IMPRESSION: 1. Normal head CT.
2. ASPECTS is 10.

## 2023-02-18 IMAGING — US US EXTREM LOW VENOUS*L*
1 series · 13 of 24 positions shown · non-contrast
Comparison: None.

CLINICAL DATA: Left lower extremity pain. The patient is currently
on anticoagulation. Evaluate for DVT.



[Series 1: us venous img lower uni left (dvt) · portal-venous · 13 of 45 slices shown]
[im 1/45]
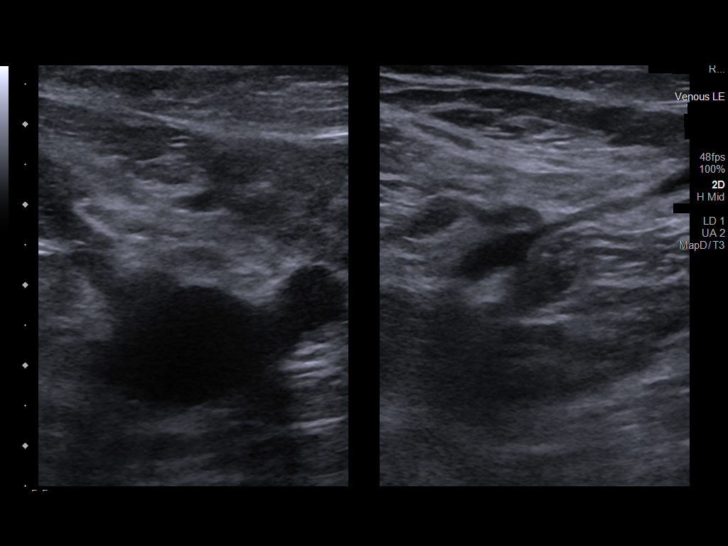
[im 4/45]
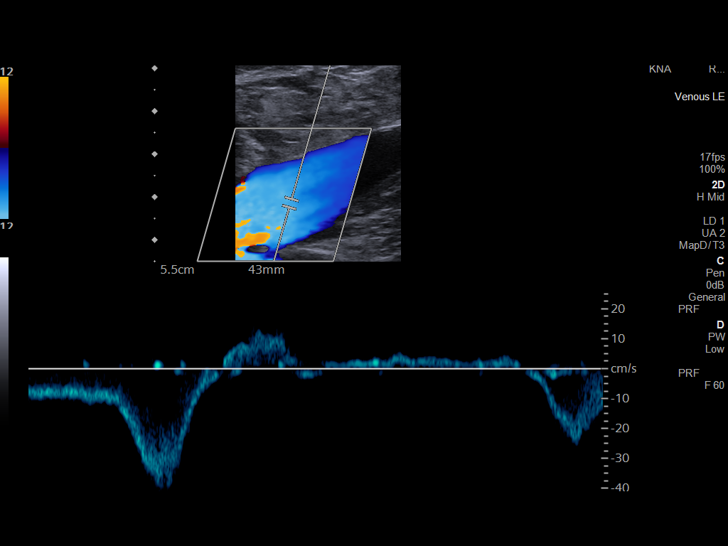
[im 8/45]
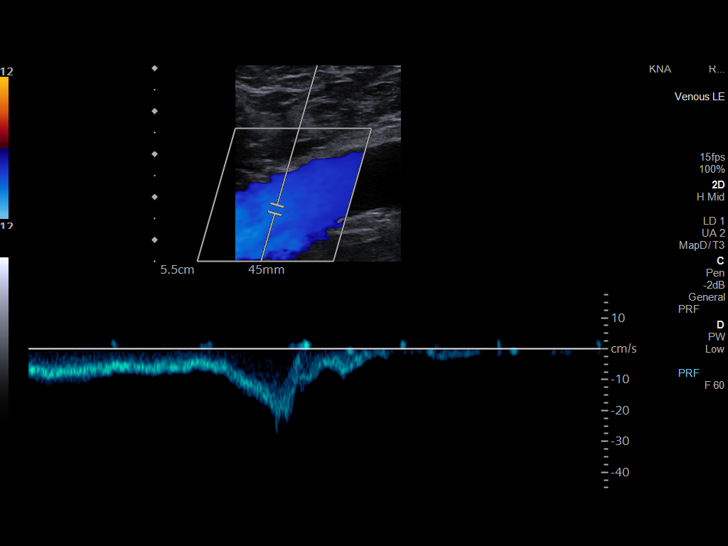
[im 12/45]
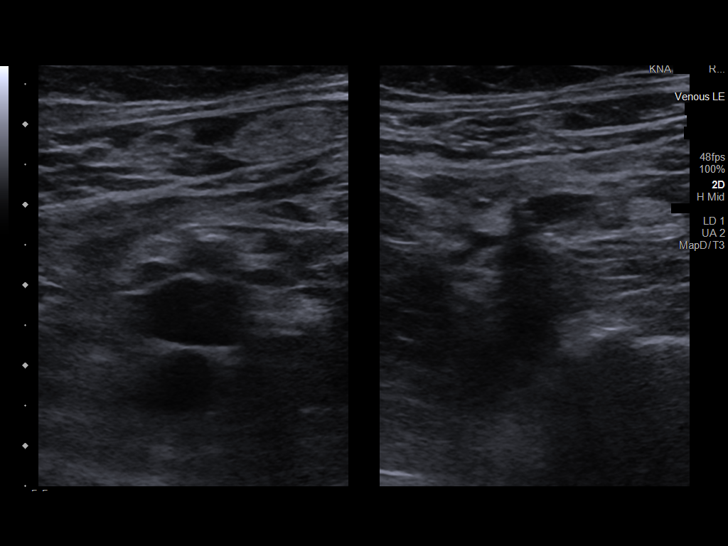
[im 16/45]
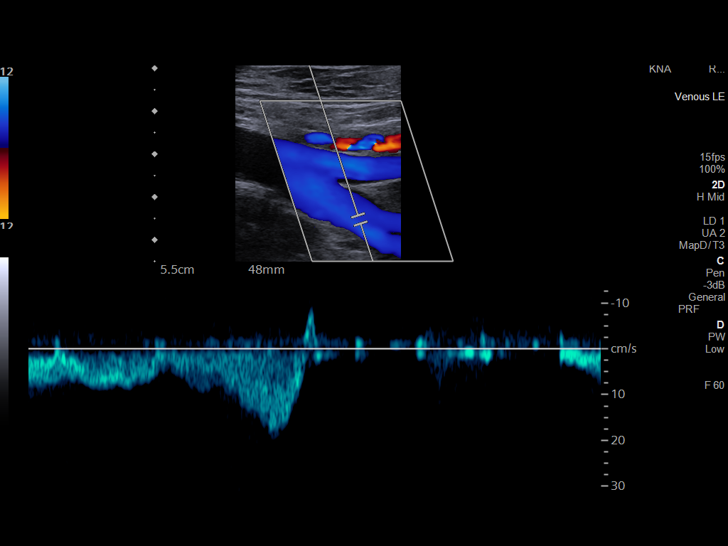
[im 20/45]
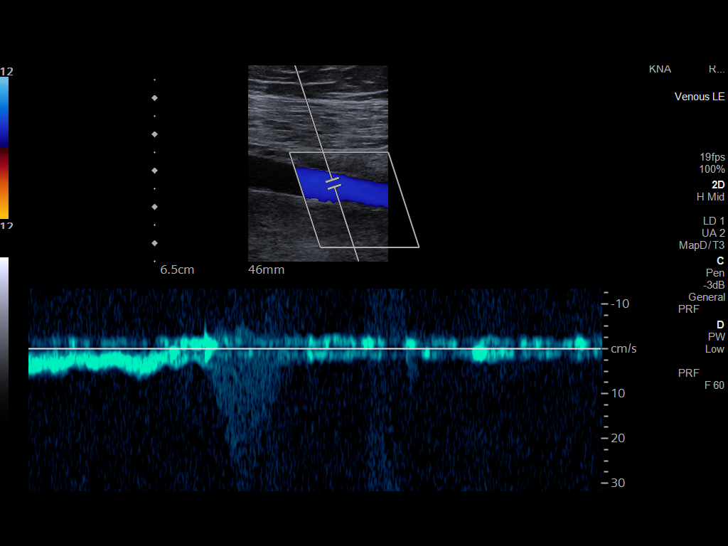
[im 23/45]
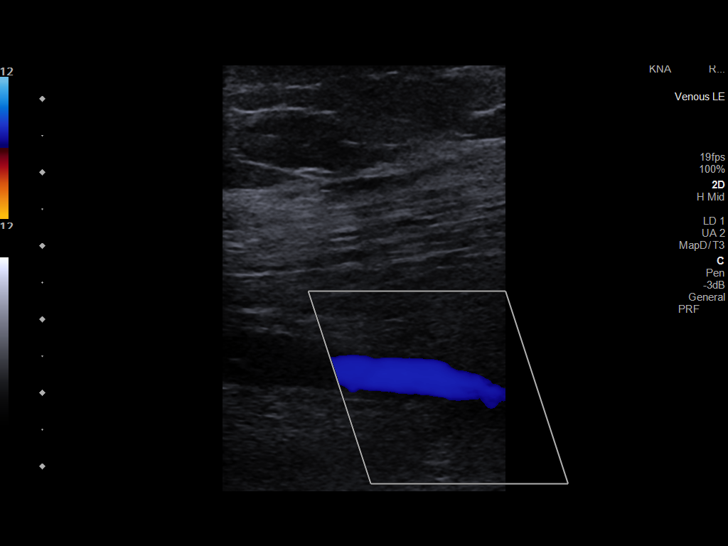
[im 25/45]
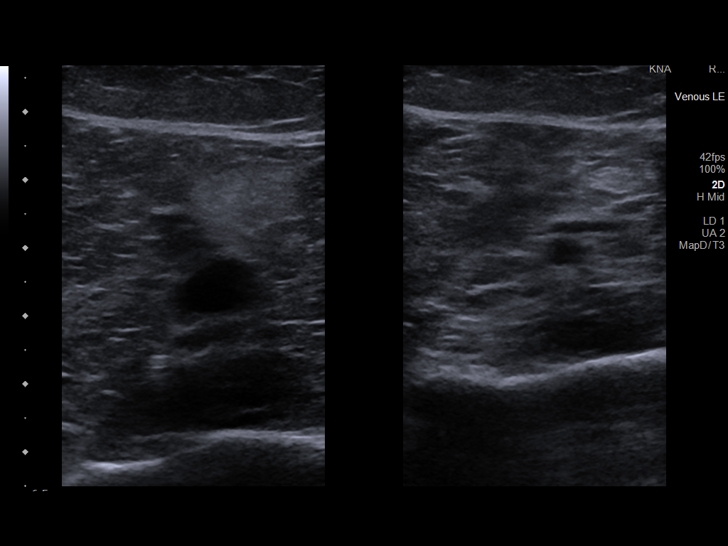
[im 29/45]
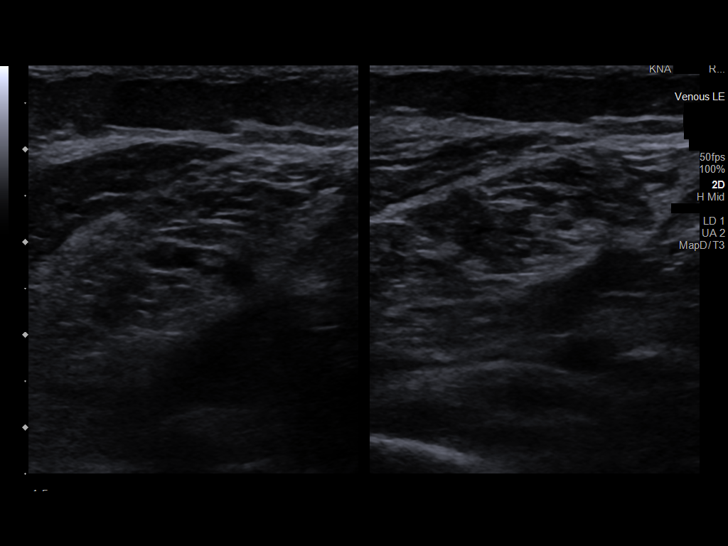
[im 33/45]
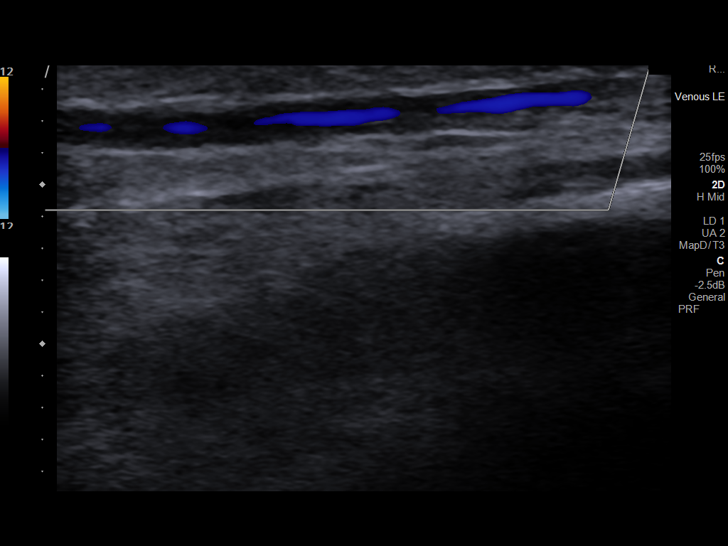
[im 37/45]
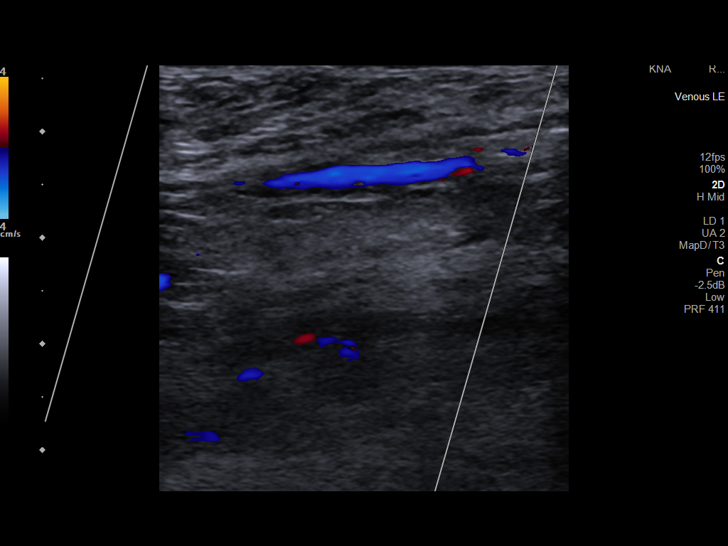
[im 41/45]
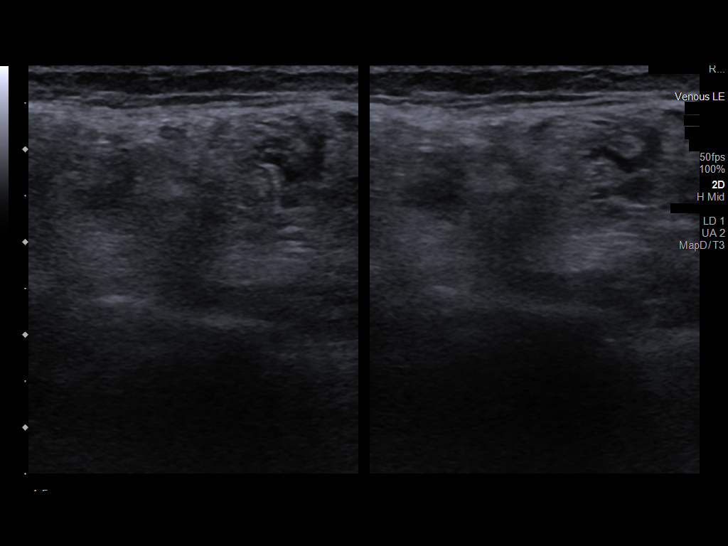
[im 45/45]
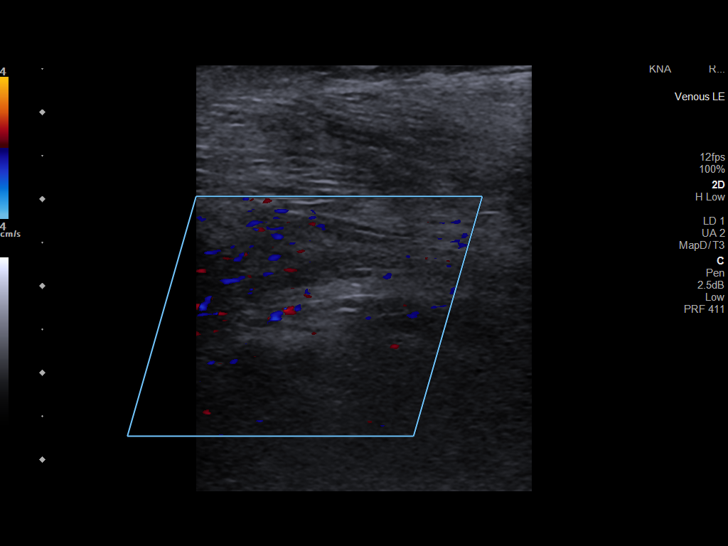

[13 of 24 positions shown; findings below may reference images not displayed]

FINDINGS: Contralateral Common Femoral Vein: Respiratory phasicity is normal
and symmetric with the symptomatic side. No evidence of thrombus.
Normal compressibility.

Common Femoral Vein: No evidence of thrombus. Normal
compressibility, respiratory phasicity and response to augmentation.

Saphenofemoral Junction: No evidence of thrombus. Normal
compressibility and flow on color Doppler imaging.

Profunda Femoral Vein: No evidence of thrombus. Normal
compressibility and flow on color Doppler imaging.

Femoral Vein: No evidence of thrombus. Normal compressibility,
respiratory phasicity and response to augmentation.

Popliteal Vein: No evidence of thrombus. Normal compressibility,
respiratory phasicity and response to augmentation.

Calf Veins: No evidence of thrombus. Normal compressibility and flow
on color Doppler imaging.

Superficial Great Saphenous Vein: No evidence of thrombus. Normal
compressibility.

Venous Reflux:  None.

Other Findings:  None.
IMPRESSION: No evidence of DVT within the left lower extremity.
